# Patient Record
Sex: Female | Born: 1935 | Race: White | Hispanic: No | Marital: Married | Smoking: Never smoker
Health system: Southern US, Community
[De-identification: ages and names within clinical notes are randomized; demographics above are authoritative.]

## PROBLEM LIST (undated history)

## (undated) DIAGNOSIS — Z86010 Personal history of colonic polyps: Secondary | ICD-10-CM

## (undated) DIAGNOSIS — B029 Zoster without complications: Secondary | ICD-10-CM

## (undated) DIAGNOSIS — C50919 Malignant neoplasm of unspecified site of unspecified female breast: Secondary | ICD-10-CM

## (undated) DIAGNOSIS — I10 Essential (primary) hypertension: Secondary | ICD-10-CM

## (undated) DIAGNOSIS — J45909 Unspecified asthma, uncomplicated: Secondary | ICD-10-CM

## (undated) DIAGNOSIS — M8580 Other specified disorders of bone density and structure, unspecified site: Secondary | ICD-10-CM

## (undated) DIAGNOSIS — E785 Hyperlipidemia, unspecified: Secondary | ICD-10-CM

## (undated) DIAGNOSIS — M4850XA Collapsed vertebra, not elsewhere classified, site unspecified, initial encounter for fracture: Secondary | ICD-10-CM

## (undated) HISTORY — DX: Hyperlipidemia, unspecified: E78.5

## (undated) HISTORY — DX: Essential (primary) hypertension: I10

## (undated) HISTORY — DX: Other specified disorders of bone density and structure, unspecified site: M85.80

## (undated) HISTORY — PX: UPPER GASTROINTESTINAL ENDOSCOPY: SHX188

## (undated) HISTORY — DX: Collapsed vertebra, not elsewhere classified, site unspecified, initial encounter for fracture: M48.50XA

## (undated) HISTORY — DX: Unspecified asthma, uncomplicated: J45.909

## (undated) HISTORY — PX: DILATION AND CURETTAGE OF UTERUS: SHX78

## (undated) HISTORY — DX: Personal history of colonic polyps: Z86.010

## (undated) HISTORY — DX: Malignant neoplasm of unspecified site of unspecified female breast: C50.919

## (undated) HISTORY — DX: Zoster without complications: B02.9

---
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
## 1970-06-25 HISTORY — PX: TOTAL ABDOMINAL HYSTERECTOMY: SHX209

## 1997-11-23 ENCOUNTER — Ambulatory Visit (HOSPITAL_BASED_OUTPATIENT_CLINIC_OR_DEPARTMENT_OTHER): Admission: RE | Admit: 1997-11-23 | Discharge: 1997-11-23 | Payer: Self-pay | Admitting: Orthopedic Surgery

## 1999-11-08 ENCOUNTER — Encounter: Payer: Self-pay | Admitting: Family Medicine

## 1999-11-08 ENCOUNTER — Encounter: Admission: RE | Admit: 1999-11-08 | Discharge: 1999-11-08 | Payer: Self-pay | Admitting: Family Medicine

## 1999-12-08 ENCOUNTER — Encounter: Payer: Self-pay | Admitting: Family Medicine

## 1999-12-08 ENCOUNTER — Encounter: Admission: RE | Admit: 1999-12-08 | Discharge: 1999-12-08 | Payer: Self-pay | Admitting: Family Medicine

## 2000-01-03 ENCOUNTER — Encounter: Payer: Self-pay | Admitting: Surgery

## 2000-01-05 ENCOUNTER — Observation Stay (HOSPITAL_COMMUNITY): Admission: RE | Admit: 2000-01-05 | Discharge: 2000-01-06 | Payer: Self-pay | Admitting: Surgery

## 2000-01-23 ENCOUNTER — Other Ambulatory Visit: Admission: RE | Admit: 2000-01-23 | Discharge: 2000-01-23 | Payer: Self-pay | Admitting: Family Medicine

## 2000-06-25 HISTORY — PX: CHOLECYSTECTOMY: SHX55

## 2001-01-23 ENCOUNTER — Other Ambulatory Visit: Admission: RE | Admit: 2001-01-23 | Discharge: 2001-01-23 | Payer: Self-pay | Admitting: Family Medicine

## 2001-01-24 ENCOUNTER — Encounter: Payer: Self-pay | Admitting: Family Medicine

## 2001-01-24 ENCOUNTER — Encounter: Admission: RE | Admit: 2001-01-24 | Discharge: 2001-01-24 | Payer: Self-pay | Admitting: Family Medicine

## 2002-06-15 ENCOUNTER — Other Ambulatory Visit: Admission: RE | Admit: 2002-06-15 | Discharge: 2002-06-15 | Payer: Self-pay | Admitting: Radiology

## 2002-06-25 HISTORY — PX: BREAST LUMPECTOMY: SHX2

## 2002-07-08 ENCOUNTER — Encounter: Payer: Self-pay | Admitting: Surgery

## 2002-07-08 ENCOUNTER — Encounter: Admission: RE | Admit: 2002-07-08 | Discharge: 2002-07-08 | Payer: Self-pay | Admitting: Surgery

## 2002-07-10 ENCOUNTER — Encounter: Admission: RE | Admit: 2002-07-10 | Discharge: 2002-07-10 | Payer: Self-pay | Admitting: Surgery

## 2002-07-10 ENCOUNTER — Encounter: Payer: Self-pay | Admitting: Surgery

## 2002-07-10 ENCOUNTER — Ambulatory Visit (HOSPITAL_BASED_OUTPATIENT_CLINIC_OR_DEPARTMENT_OTHER): Admission: RE | Admit: 2002-07-10 | Discharge: 2002-07-10 | Payer: Self-pay | Admitting: Surgery

## 2002-07-10 ENCOUNTER — Encounter (INDEPENDENT_AMBULATORY_CARE_PROVIDER_SITE_OTHER): Payer: Self-pay | Admitting: Specialist

## 2002-07-21 ENCOUNTER — Ambulatory Visit: Admission: RE | Admit: 2002-07-21 | Discharge: 2002-10-13 | Payer: Self-pay | Admitting: *Deleted

## 2002-11-13 ENCOUNTER — Ambulatory Visit: Admission: RE | Admit: 2002-11-13 | Discharge: 2002-11-13 | Payer: Self-pay | Admitting: *Deleted

## 2003-02-01 ENCOUNTER — Other Ambulatory Visit: Admission: RE | Admit: 2003-02-01 | Discharge: 2003-02-01 | Payer: Self-pay | Admitting: Family Medicine

## 2003-11-29 ENCOUNTER — Encounter: Admission: RE | Admit: 2003-11-29 | Discharge: 2003-11-29 | Payer: Self-pay | Admitting: Family Medicine

## 2004-09-25 ENCOUNTER — Ambulatory Visit: Payer: Self-pay | Admitting: Oncology

## 2005-03-26 ENCOUNTER — Ambulatory Visit: Payer: Self-pay | Admitting: Oncology

## 2005-07-23 ENCOUNTER — Ambulatory Visit: Payer: Self-pay | Admitting: Oncology

## 2005-09-17 ENCOUNTER — Ambulatory Visit: Payer: Self-pay | Admitting: Oncology

## 2006-04-17 ENCOUNTER — Ambulatory Visit: Payer: Self-pay | Admitting: Oncology

## 2006-06-25 DIAGNOSIS — M4850XA Collapsed vertebra, not elsewhere classified, site unspecified, initial encounter for fracture: Secondary | ICD-10-CM

## 2006-06-25 HISTORY — DX: Collapsed vertebra, not elsewhere classified, site unspecified, initial encounter for fracture: M48.50XA

## 2006-07-16 ENCOUNTER — Encounter: Admission: RE | Admit: 2006-07-16 | Discharge: 2006-07-16 | Payer: Self-pay | Admitting: Radiology

## 2006-10-15 ENCOUNTER — Ambulatory Visit: Payer: Self-pay | Admitting: Oncology

## 2006-10-18 LAB — COMPREHENSIVE METABOLIC PANEL
ALT: 14 U/L (ref 0–35)
AST: 22 U/L (ref 0–37)
Albumin: 4.3 g/dL (ref 3.5–5.2)
CO2: 26 mEq/L (ref 19–32)
Potassium: 4.4 mEq/L (ref 3.5–5.3)
Total Bilirubin: 0.3 mg/dL (ref 0.3–1.2)

## 2006-10-18 LAB — CBC WITH DIFFERENTIAL/PLATELET
BASO%: 0.8 % (ref 0.0–2.0)
EOS%: 2.2 % (ref 0.0–7.0)
MCHC: 35.2 g/dL (ref 32.0–36.0)
MCV: 90.4 fL (ref 81.0–101.0)
NEUT#: 2.7 10*3/uL (ref 1.5–6.5)
Platelets: 317 10*3/uL (ref 145–400)
RBC: 4.16 10*6/uL (ref 3.70–5.32)

## 2007-04-14 ENCOUNTER — Ambulatory Visit: Payer: Self-pay | Admitting: Oncology

## 2007-10-13 ENCOUNTER — Ambulatory Visit: Payer: Self-pay | Admitting: Oncology

## 2007-10-15 LAB — COMPREHENSIVE METABOLIC PANEL
ALT: 16 U/L (ref 0–35)
Albumin: 4.4 g/dL (ref 3.5–5.2)
Alkaline Phosphatase: 76 U/L (ref 39–117)
BUN: 14 mg/dL (ref 6–23)
CO2: 28 mEq/L (ref 19–32)
Calcium: 9.5 mg/dL (ref 8.4–10.5)
Total Protein: 7.4 g/dL (ref 6.0–8.3)

## 2008-04-25 DIAGNOSIS — I10 Essential (primary) hypertension: Secondary | ICD-10-CM

## 2008-04-25 HISTORY — DX: Essential (primary) hypertension: I10

## 2010-04-05 ENCOUNTER — Encounter (INDEPENDENT_AMBULATORY_CARE_PROVIDER_SITE_OTHER): Payer: Self-pay | Admitting: *Deleted

## 2010-07-15 ENCOUNTER — Encounter: Payer: Self-pay | Admitting: Family Medicine

## 2010-07-25 NOTE — Letter (Signed)
Summary: Colonoscopy Date Change Letter  [facility]  [address] [place], [place] [address]   Phone: [phone]  Fax: [phone]      April 05, 2010 MRN: [id number]   Brandy Andrade [address]  [address] [place], [place]  [address]   Dear Ms. Mable Paris,   Previously you were recommended to have a repeat colonoscopy around this time. Your chart was recently reviewed by Dr. Leone Payor of [facility]. Follow up colonoscopy is now recommended in September 2014. This revised recommendation is based on current, nationally recognized guidelines for colorectal cancer screening and polyp surveillance. These guidelines are endorsed by the American Cancer Society, [employer] on Colorectal Cancer as well as numerous other major medical organizations.  Please understand that our recommendation assumes that you do not have any new symptoms such as bleeding, a change in bowel habits, anemia, or significant abdominal discomfort. If you do have any concerning GI symptoms or want to discuss the guideline recommendations, please call to arrange an office visit at your earliest convenience. Otherwise we will keep you in our reminder system and contact you 1-2 months prior to the date listed above to schedule your next colonoscopy.  Thank you,   Iva Boop, M.D.  [facility] [phone]

## 2010-11-10 NOTE — Op Note (Signed)
NAME:  Brandy Andrade, Brandy Andrade                       ACCOUNT NO.:  [id number]   MEDICAL RECORD NO.:  [id number]                   PATIENT TYPE:  AMB   LOCATION:  DSC                                  FACILITY:  [facility]   PHYSICIAN:  Currie Paris, M.D.           DATE OF BIRTH:  23-Jan-1935   DATE OF PROCEDURE:  07/10/2002  DATE OF DISCHARGE:                                 OPERATIVE REPORT   [facility] [phone].   PREOPERATIVE DIAGNOSIS:  Carcinoma of left breast upper outer quadrant.   POSTOPERATIVE DIAGNOSIS:  Carcinoma of left breast upper outer quadrant.   PROCEDURE:  Needle-guided excision of left breast cancer with blue dye  injection and sentinel lymph node dissection.   SURGEON:  Currie Paris, M.D.   ANESTHESIA:  General.   CLINICAL HISTORY:  The patient is a 76 year old who originally had an  abnormality seen on mammography, and a core biopsy had shown invasive  carcinoma.  After a discussion with the patient about alternatives, we  elected to do a needle-guided excision with blue dye injection for sentinel  node dissection.   DESCRIPTION OF PROCEDURE:  The patient was seen in the holding area and had  no further questions.  Two guidewires to bracket the area had been placed  and her radionuclide injected.  The right side was marked as the operative  side.   The patient was taken to the operating room and after satisfactory general  anesthesia, the breast was prepped and draped.  Four cubic centimeters of  Lymphazurin blue dye was then injected subareolarly and massaged in.  While  we were doing that, I tried to identify an area in the axilla that was hot  using the Neoprobe but could only find one area that had some slightly  elevated counts, with everything else being virtually zero.  Once I had  waited the appropriate five minutes, I elected to go ahead with the  lumpectomy first.  I made a curvilinear incision.  There were two  guidewires, one more superior to the  other but both in the upper outer  quadrant.  They both entered almost directly laterally and tracked almost  directly medially.  The curvilinear incision was made and I used cautery to  divide about a centimeter of breast fatty tissue and then, using the cautery  again, I took a wide area around both guidewires, trying to encompass both  of them, and going down as we went down and encountered the pectoralis  muscle and these tracked anterior to the muscle, so I continued the excision  until really I was almost to the midline, fairly lateral guidewire entrance  site.  When the specimen was out, I put some clips on it to mark it and I  could palpate the tumor, and I thought I was somewhat close to what would  actually be the inferior edge near the deep area of the excision.  I  immediately went back and took another 1.5 cm of tissue around this area to  be sure that I had a clear margin.  Bleeders were electrocoagulated.  Marcaine 0.5% was injected to help with the postop analgesia.  Clips were  marked to mark the margins using four clips on the periphery and a large  clip superior and deep and superficial.   While waiting for that, I went ahead and made a transverse incision in the  axilla.  As I divided down, I never did identify any blue dye except one  tiny lymphatic that did not seem to go anyplace.  Palpation revealed no  enlarged nodes but using the Neoprobe, I found one area that had some  slightly high counts and a little bit more dissection there revealed a node,  which I was able to excise.  It had counts of about 20-24, and everything in  the background was 0-3 and I never saw any other counts higher than 3  anyplace else in the axilla.  Most of the counts around the nipple-areolar  complex were in the up to 1000 with one area that was up to 1700, but a  little bit lower than I normally see in that area.   I sent this one node over and then spent a few more minutes using the   Neoprobe trying to see if there were any other nodes or hot areas that I  could find and looked a little further for some blue dye, and then palpated  very carefully to make sure I could not feel any large nodes, and all this  was negative.   I then injected some Marcaine here.  I did use a clip on one bleeder here  and then mainly the cautery.   Both incisions were closed with 3-0 Vicryl and 4-0 Monocryl.  I left the  breast cavity, did not try to close the breast tissue but just the subcu and  the dermis.   Dr. Almyra Free reported that the sentinel node was negative.  She did not feel  that she would gain much from touch preps, as we at least grossly looked  well around the tumor, especially with the extra area taken.   The patient tolerated the procedure well.  There were no operative  complications.  All counts were correct.                                               Currie Paris, M.D.    CJS/MEDQ  D:  07/10/2002  T:  07/11/2002  Job:  [id number]   cc:   Quita Skye. Artis Flock, M.D.  [address]  [place]  [place] [address]  Fax: [phone]   Jeralyn Ruths, M.D.

## 2011-08-22 ENCOUNTER — Encounter: Payer: Self-pay | Admitting: Internal Medicine

## 2011-08-22 ENCOUNTER — Ambulatory Visit (INDEPENDENT_AMBULATORY_CARE_PROVIDER_SITE_OTHER): Payer: Medicare Other | Admitting: Internal Medicine

## 2011-08-22 VITALS — BP 140/88 | HR 103 | Temp 98.9°F | Ht 59.5 in | Wt 155.6 lb

## 2011-08-22 DIAGNOSIS — R05 Cough: Secondary | ICD-10-CM

## 2011-08-22 DIAGNOSIS — R053 Chronic cough: Secondary | ICD-10-CM | POA: Insufficient documentation

## 2011-08-22 MED ORDER — FLUTICASONE PROPIONATE 50 MCG/ACT NA SUSP: 2.0000 | Freq: Every day | NASAL | Status: DC

## 2011-08-22 NOTE — Patient Instructions (Addendum)
Cough is from sinus drainage, possible acid reflux, and possible asthma and possibly due to bp drug losartan #Sinus drainage  - start/continue netti pot daily  - do at night. Use bottled or distilled water.  -  start nasal steroid generic fluticasone inhaler 2 squirts each nostril daily as advised (nurse will do script)  #Possible Acid Reflux  - take otc ranitidine 300mg  at night   - take diet sheet from [place] - avoid colas, spices, cheeses, spirits, red meats, beer, chocolates, fried foods etc.,   - sleep with head end of bed elevated  - eat small frequent meals  - do - depending on course might need barium swallow given difficulty with tabletsnot go to bed for 3 hours after last meal -  #Possible Asthma  - do methacholine challenge test (you cannot be pregnant or have heart disease for this test)  - call once done for me to review this result  #Cyclical cough  -  at all times there  there is urge to cough, drink water or swallow or sip on throat lozenge  #BP drug  - depending on how you do we can decide on stopping losartan in case this is contributing to cough  #Followup - I will see you in 4 - any problems call or come sooner - cough score at followup

## 2011-08-22 NOTE — Progress Notes (Signed)
Subjective:    Patient ID: Brandy Andrade, female    DOB: [birth year], 77 y.o.   MRN: [id number]  HPI  IOV 08/22/2011 77 year old  Body mass index is 30.90 kg/(m^2).  reports that she has never smoked. She does not have any smokeless tobacco history on file. Referred by Dr Elberta Spaniel for chronic cough  Reports cough since October 2012. Insidious onset. Initially thought it was allergy. She remembers abrupt onset when she was pulling weeds and since then cough never subsided. Mostly dry in quality but past few days productive of clear sputum that she thinks is due to increased sinus drainage. Rates cough as severe. She reports associated wheeze for first 1 month but this is resolved. Stable since onset. She denies tickle in throat normally other than today due to sinus drainage increase past few days. Does not normally gag but sometimes when cough is worse she gags.  Denies associated dyspnea, hemoptysis, fever, URI, flu symptoms, chest pains or clearing of throat. She is finding it socially embarrassing.   RSI cough score 10 (leve 5 - annoying cough, Leve 1 - hoarseness, clearing of throat, post nasal drip dificulty with tablets, and sensation of something in throat)   She is not on ACE inhibitors but on ARB losartan since 2003. She has hx of actonel making cough worse similar to current episode and since then trouble with tablets +. Cough resolved on stopping actonel within 2 weeks. Denies GERD. Denies chronic sinus drainage other than spring and fall and  during periods of low pressure in atmosphere past few days acting up. However, she denies sinus is making cough worse. Denies asthma diagnosis. Recollect 2 x normal cxr since oct 2012. REcollects trial of nexium Aug 05, 2010 but stopped after a week due to diarrhea. REcollects H2 blockade trial an ICS trial in nov 2012 short term without relief.     CT sinus 11/29/2003: no evidence of acute sinusitis per report (PATIENT INSISTS THIS WAS HER  HUSBAND'S SCAN OF SINUS AND IS AN ERROR IN EMR)   Past Medical History  Diagnosis Date  . Hyperlipidemia   . Glaucoma   . Osteopenia   . Breast cancer   . Post-nasal drainage   . Cough      Family History  Problem Relation Age of Onset  . Heart attack Mother   . Breast cancer      several aunts  . Emphysema Sister      History   Social History  . Marital Status: Married    Spouse Name: N/A    Number of Children: N/A  . Years of Education: N/A   Occupational History  . retired    Social History Main Topics  . Smoking status: Never Smoker   . Smokeless tobacco: Not on file  . Alcohol Use: No  . Drug Use: No  . Sexually Active: Not on file   Other Topics Concern  . Not on file   Social History Narrative  . No narrative on file     No Known Allergies   No outpatient prescriptions prior to visit.   Current outpatient prescriptions:CRESTOR 10 MG tablet, Take 1 tablet by mouth Daily., Disp: , Rfl: ;  losartan-hydrochlorothiazide (HYZAAR) 50-12.5 MG per tablet, Take 1 tablet by mouth daily., Disp: , Rfl: ;  NON FORMULARY, xlatan pohth drops, Disp: , Rfl:     Review of Systems  Constitutional: Negative for fever and unexpected weight change.  HENT: Positive for congestion  and postnasal drip. Negative for ear pain, nosebleeds, sore throat, rhinorrhea, sneezing, trouble swallowing, dental problem and sinus pressure.   Eyes: Negative for redness and itching.  Respiratory: Positive for cough. Negative for chest tightness, shortness of breath and wheezing.   Cardiovascular: Negative for palpitations and leg swelling.  Gastrointestinal: Negative for nausea and vomiting.  Genitourinary: Negative for dysuria.  Musculoskeletal: Negative for joint swelling.  Skin: Negative for rash.  Neurological: Negative for headaches.  Hematological: Does not bruise/bleed easily.  Psychiatric/Behavioral: Negative for dysphoric mood. The patient is not nervous/anxious.          Objective:   Physical Exam  Vitals reviewed. Constitutional: She is oriented to person, place, and time. She appears well-developed and well-nourished. No distress.       Body mass index is 30.90 kg/(m^2).   HENT:  Head: Normocephalic and atraumatic.  Right Ear: External ear normal.  Left Ear: External ear normal.  Mouth/Throat: Oropharynx is clear and moist. No oropharyngeal exudate.       Significant post nasal drip + in uvula  Eyes: Conjunctivae and EOM are normal. Pupils are equal, round, and reactive to light. Right eye exhibits no discharge. Left eye exhibits no discharge. No scleral icterus.  Neck: Normal range of motion. Neck supple. No JVD present. No tracheal deviation present. No thyromegaly present.  Cardiovascular: Normal rate, regular rhythm, normal heart sounds and intact distal pulses.  Exam reveals no gallop and no friction rub.   No murmur heard. Pulmonary/Chest: Effort normal and breath sounds normal. No respiratory distress. She has no wheezes. She has no rales. She exhibits no tenderness.  Abdominal: Soft. Bowel sounds are normal. She exhibits no distension and no mass. There is no tenderness. There is no rebound and no guarding.  Musculoskeletal: Normal range of motion. She exhibits no edema and no tenderness.  Lymphadenopathy:    She has no cervical adenopathy.  Neurological: She is alert and oriented to person, place, and time. She has normal reflexes. No cranial nerve deficit. She exhibits normal muscle tone. Coordination normal.  Skin: Skin is warm and dry. No rash noted. She is not diaphoretic. No erythema. No pallor.  Psychiatric: She has a normal mood and affect. Her behavior is normal. Judgment and thought content normal.          Assessment & Plan:

## 2011-08-22 NOTE — Assessment & Plan Note (Signed)
Cough is from sinus drainage, possible acid reflux, and possible asthma and possibly due to bp drug losartan #Sinus drainage  - start/continue netti pot daily  - do at night. Use bottled or distilled water.  -  start nasal steroid generic fluticasone inhaler 2 squirts each nostril daily as advised (nurse will do script)  #Possible Acid Reflux  - take otc ranitidine 300mg  at night   - take diet sheet from [place] - avoid colas, spices, cheeses, spirits, red meats, beer, chocolates, fried foods etc.,   - sleep with head end of bed elevated  - eat small frequent meals  - do not go to bed for 3 hours after last meal - depending on course might need barium swallow given difficulty with tablets  #Possible Asthma  - do methacholine challenge test (you cannot be pregnant or have heart disease for this test)  - call once done for me to review this result  #Cyclical cough  -  at all times there  there is urge to cough, drink water or swallow or sip on throat lozenge  #BP drug  - depending on how you do we can decide on stopping losartan in case this is contributing to cough  #Followup - I will see you in 4 - any problems call or come sooner - cough score at followup

## 2011-08-27 ENCOUNTER — Encounter: Payer: Self-pay | Admitting: Internal Medicine

## 2011-08-31 ENCOUNTER — Ambulatory Visit (HOSPITAL_COMMUNITY)
Admission: RE | Admit: 2011-08-31 | Discharge: 2011-08-31 | Disposition: A | Discharge status: Home / Self Care | Payer: Medicare Other | Source: Ambulatory Visit | Attending: Internal Medicine | Admitting: Internal Medicine

## 2011-08-31 DIAGNOSIS — R059 Cough, unspecified: Secondary | ICD-10-CM | POA: Insufficient documentation

## 2011-08-31 DIAGNOSIS — R05 Cough: Secondary | ICD-10-CM

## 2011-08-31 LAB — PULMONARY FUNCTION TEST

## 2011-08-31 MED ORDER — METHACHOLINE 4 MG/ML NEB SOLN: 2.0000 mL | Freq: Once | RESPIRATORY_TRACT | Status: DC

## 2011-08-31 MED ORDER — SODIUM CHLORIDE 0.9 % IN NEBU
3.0000 mL | INHALATION_SOLUTION | Freq: Once | RESPIRATORY_TRACT | Status: AC
  Administered 2011-08-31: 3 mL via RESPIRATORY_TRACT

## 2011-08-31 MED ORDER — METHACHOLINE 1 MG/ML NEB SOLN
2.0000 mL | Freq: Once | RESPIRATORY_TRACT | Status: AC
  Administered 2011-08-31: 2 mg via RESPIRATORY_TRACT

## 2011-08-31 MED ORDER — METHACHOLINE 0.0625 MG/ML NEB SOLN
2.0000 mL | Freq: Once | RESPIRATORY_TRACT | Status: AC
  Administered 2011-08-31: 0.125 mg via RESPIRATORY_TRACT

## 2011-08-31 MED ORDER — METHACHOLINE 16 MG/ML NEB SOLN: 2.0000 mL | Freq: Once | RESPIRATORY_TRACT | Status: DC

## 2011-08-31 MED ORDER — ALBUTEROL SULFATE (5 MG/ML) 0.5% IN NEBU
2.5000 mg | INHALATION_SOLUTION | Freq: Once | RESPIRATORY_TRACT | Status: AC
  Administered 2011-08-31: 2.5 mg via RESPIRATORY_TRACT

## 2011-08-31 MED ORDER — METHACHOLINE 0.25 MG/ML NEB SOLN
2.0000 mL | Freq: Once | RESPIRATORY_TRACT | Status: AC
  Administered 2011-08-31: 0.5 mg via RESPIRATORY_TRACT

## 2011-09-05 ENCOUNTER — Telehealth: Payer: Self-pay | Admitting: Internal Medicine

## 2011-09-05 NOTE — Telephone Encounter (Signed)
MR, please advise results of MCT thanks!

## 2011-09-05 NOTE — Telephone Encounter (Signed)
Pt notified and was fine with waiting a few days for a call on the methacholine test results. Will forward back to MR so we may call the pt once results have been reviewed.

## 2011-09-05 NOTE — Telephone Encounter (Signed)
JC put several papers on my desk this am. I will check if it is there. It might be few days before I get back

## 2011-09-06 NOTE — Telephone Encounter (Signed)
I spoke with pt and is aware of results. She had no questions and will discuss this with MR at her next OV

## 2011-09-06 NOTE — Telephone Encounter (Signed)
Methacholine challenge 08/31/11: positive for asthma vs vocal cord irritation. I will explain on fu 09/19/11

## 2011-09-19 ENCOUNTER — Encounter: Payer: Self-pay | Admitting: Internal Medicine

## 2011-09-19 ENCOUNTER — Ambulatory Visit (INDEPENDENT_AMBULATORY_CARE_PROVIDER_SITE_OTHER): Payer: Medicare Other | Admitting: Internal Medicine

## 2011-09-19 VITALS — BP 120/78 | HR 78 | Temp 98.0°F | Ht 59.0 in | Wt 154.6 lb

## 2011-09-19 DIAGNOSIS — R05 Cough: Secondary | ICD-10-CM

## 2011-09-19 NOTE — Patient Instructions (Addendum)
Cough is from sinus drainage, possible acid reflux, and  asthma  The Methacholine challenge test shows asthma and also likely vocal cord irritation Glad you are much improved  #Sinus drainage - glad this is resolved  - continue netti pot do few times a week for next month and then back off and then see how it goes daily  - do at night. Use bottled or distilled water.  -  Continue  nasal steroid generic fluticasone inhaler 1 squirts each nostril daily as advised through summer and then back off  #Possible Acid Reflux  - continue otc ranitidine 300mg  at night   - take diet sheet from [place] - avoid colas, spices, cheeses, spirits, red meats, beer, chocolates, fried foods etc.,   - sleep with head end of bed elevated  - eat small frequent meals - depending on course might need barium swallow given difficulty with tablets  - do not go to bed for 3 hours after last meal -  #e Asthma  - methaacholine challenge test shows asthma - try qvar 2 puff twice daily but use with spacer (my nurse will call Dr Geryl Rankins office in [facility] and get clearance) - at followup check alpha 1   #Cyclical cough  - the methacholine challenge test suggests vocal cord irritation  - please visit with Mr Verdie Mosher speech therapist  -  at all times there  there is urge to cough, drink water or swallow or sip on throat lozenge  #BP drug  - depending on how you do we can decide on stopping losartan in case this is contributing to cough  #Followup - I will see you in 6-8 weks - any problems call or come sooner - cough score at followup - alpha 1 check at followup

## 2011-09-19 NOTE — Progress Notes (Addendum)
Subjective:    Patient ID: Brandy Andrade, female    DOB: 1935-05-30, 77 y.o.   MRN: [id number]  HPI  Subjective:    Patient ID: Brandy Andrade, female    DOB: [birth year], 77 y.o.   MRN: [id number]  HPI  IOV 08/22/2011 77 year old  Body mass index is 30.90 kg/(m^2).  reports that she has never smoked. She does not have any smokeless tobacco history on file. Referred by Dr Elberta Spaniel for chronic cough  Reports cough since October 2012. Insidious onset. Initially thought it was allergy. She remembers abrupt onset when she was pulling weeds and since then cough never subsided. Mostly dry in quality but past few days productive of clear sputum that she thinks is due to increased sinus drainage. Rates cough as severe. She reports associated wheeze for first 1 month but this is resolved. Stable since onset. She denies tickle in throat normally other than today due to sinus drainage increase past few days. Does not normally gag but sometimes when cough is worse she gags.  Denies associated dyspnea, hemoptysis, fever, URI, flu symptoms, chest pains or clearing of throat. She is finding it socially embarrassing.   RSI cough score 10 (leve 5 - annoying cough, Leve 1 - hoarseness, clearing of throat, post nasal drip dificulty with tablets, and sensation of something in throat)   She is not on ACE inhibitors but on ARB losartan since 2003. She has hx of actonel making cough worse similar to current episode and since then trouble with tablets +. Cough resolved on stopping actonel within 2 weeks. Denies GERD. Denies chronic sinus drainage other than spring and fall and  during periods of low pressure in atmosphere past few days acting up. However, she denies sinus is making cough worse. Denies asthma diagnosis. Recollect 2 x normal cxr since oct 2012. REcollects trial of nexium Aug 05, 2010 but stopped after a week due to diarrhea. REcollects H2 blockade trial an ICS trial in nov 2012 short term without  relief.     CT sinus 11/29/2003: no evidence of acute sinusitis per report (PATIENT INSISTS THIS WAS HER HUSBAND'S SCAN OF SINUS AND IS AN ERROR IN EMR)  REC Cough is from sinus drainage, possible acid reflux, and possible asthma and possibly due to bp drug losartan #Sinus drainage  - start/continue netti pot daily  - do at night. Use bottled or distilled water.  -  start nasal steroid generic fluticasone inhaler 2 squirts each nostril daily as advised (nurse will do script)  #Possible Acid Reflux  - take otc ranitidine 300mg  at night (no ppi due to diarrhea)   - take diet sheet from [place] - avoid colas, spices, cheeses, spirits, red meats, beer, chocolates, fried foods etc.,   - sleep with head end of bed elevated  - eat small frequent meals  depending on course might need barium swallow given difficulty with tablets Do not go to bed for 3 hours after last meal -  #Possible Asthma  - do methacholine challenge test (you cannot be pregnant or have heart disease for this test)  - call once done for me to review this result  #Cyclical cough  -  at all times there  there is urge to cough, drink water or swallow or sip on throat lozenge  #BP drug  - depending on how you do we can decide on stopping losartan in case this is contributing to cough  #Followup - I will see you in  4 weeks - any problems call or come sooner - cough score at followup     OV 09/19/2011  Cough followup. Subjectively better significantly and she is pleased with results. Objectively, RSI cough score is unchanged. It is still 10. Level 3 - difficulty swallowing tablets. Level 2 - clearing of throat, and toublesome cough. Level 1 - hoarseness of voice, post nasal drip, and sensation of something in throat. I questioned her on the subjective and objective discrepancy; she maintains she is vastly improved from prior OV. Test restuls shows Methacholine challenge 08/31/11: positive for asthma vs vocal cord irritation. THere  is no insp loop flattening but expiration has cough  Past, Family, Social reviewed: multiple family members with asthma / copd   Current outpatient prescriptions:beclomethasone (QVAR) 80 MCG/ACT inhaler, Inhale 2 puffs into the lungs 2 (two) times daily., Disp: , Rfl: ;  CRESTOR 10 MG tablet, Take 1 tablet by mouth Daily., Disp: , Rfl: ;  fluticasone (FLONASE) 50 MCG/ACT nasal spray, Place 2 sprays into the nose daily., Disp: 16 g, Rfl: 6;  latanoprost (XALATAN) 0.005 % ophthalmic solution, Daily., Disp: , Rfl:  losartan-hydrochlorothiazide (HYZAAR) 50-12.5 MG per tablet, Take 1 tablet by mouth daily., Disp: , Rfl: ;  ranitidine (ZANTAC) 300 MG capsule, Take 300 mg by mouth daily., Disp: , Rfl:          Objective:   Physical Exam  Vitals reviewed. Constitutional: She is oriented to person, place, and time. She appears well-developed and well-nourished. No distress.       Body mass index is 30.90 kg/(m^2).   HENT:  Head: Normocephalic and atraumatic.  Right Ear: External ear normal.  Left Ear: External ear normal.  Mouth/Throat: Oropharynx is clear and moist. No oropharyngeal exudate.       Significant post nasal drip + in uvula  Eyes: Conjunctivae and EOM are normal. Pupils are equal, round, and reactive to light. Right eye exhibits no discharge. Left eye exhibits no discharge. No scleral icterus.  Neck: Normal range of motion. Neck supple. No JVD present. No tracheal deviation present. No thyromegaly present.  Cardiovascular: Normal rate, regular rhythm, normal heart sounds and intact distal pulses.  Exam reveals no gallop and no friction rub.   No murmur heard. Pulmonary/Chest: Effort normal and breath sounds normal. No respiratory distress. She has no wheezes. She has no rales. She exhibits no tenderness.  Abdominal: Soft. Bowel sounds are normal. She exhibits no distension and no mass. There is no tenderness. There is no rebound and no guarding.  Musculoskeletal: Normal range of  motion. She exhibits no edema and no tenderness.  Lymphadenopathy:    She has no cervical adenopathy.  Neurological: She is alert and oriented to person, place, and time. She has normal reflexes. No cranial nerve deficit. She exhibits normal muscle tone. Coordination normal.  Skin: Skin is warm and dry. No rash noted. She is not diaphoretic. No erythema. No pallor.  Psychiatric: She has a normal mood and affect. Her behavior is normal. Judgment and thought content normal.          Assessment & Plan:     Review of Systems  Constitutional: Negative for fever and unexpected weight change.  HENT: Positive for postnasal drip. Negative for ear pain, nosebleeds, congestion, sore throat, rhinorrhea, sneezing, trouble swallowing, dental problem and sinus pressure.   Eyes: Negative for redness and itching.  Respiratory: Positive for cough. Negative for chest tightness, shortness of breath and wheezing.   Cardiovascular: Negative for palpitations  and leg swelling.  Gastrointestinal: Negative for nausea and vomiting.  Genitourinary: Negative for dysuria.  Musculoskeletal: Negative for joint swelling.  Skin: Negative for rash.  Neurological: Negative for headaches.  Hematological: Does not bruise/bleed easily.  Psychiatric/Behavioral: Negative for dysphoric mood. The patient is not nervous/anxious.        Objective:   Physical Exam        Assessment & Plan:

## 2011-09-25 ENCOUNTER — Encounter: Payer: Self-pay | Admitting: Internal Medicine

## 2011-09-25 NOTE — Assessment & Plan Note (Signed)
Subjectively better but objective cough score unchanged at 10  PLAN Cough is from sinus drainage, possible acid reflux, and  asthma  The Methacholine challenge test shows asthma and also likely vocal cord irritation Glad you are much improved  #Sinus drainage - glad this is resolved  - continue netti pot do few times a week for next month and then back off and then see how it goes daily  - do at night. Use bottled or distilled water.  -  Continue  nasal steroid generic fluticasone inhaler 1 squirts each nostril daily as advised through summer and then back off  #Possible Acid Reflux  - continue otc ranitidine 300mg  at night   - take diet sheet from [place] - avoid colas, spices, cheeses, spirits, red meats, beer, chocolates, fried foods etc.,   - sleep with head end of bed elevated  - eat small frequent meals - depending on course might need barium swallow given difficulty with tablets  - do not go to bed for 3 hours after last meal -  #e Asthma  - methaacholine challenge test shows asthma - try qvar 2 puff twice daily but use with spacer (my nurse will call Dr Geryl Rankins office in [facility] and get clearance)  #Cyclical cough  - the methacholine challenge test suggests vocal cord irritation  - please visit with Mr Verdie Mosher speech therapist  -  at all times there  there is urge to cough, drink water or swallow or sip on throat lozenge  #BP drug  - depending on how you do we can decide on stopping losartan in case this is contributing to cough  #Followup - I will see you in 6-8 weks - any problems call or come sooner - cough score at followup

## 2011-09-27 ENCOUNTER — Telehealth: Payer: Self-pay | Admitting: Internal Medicine

## 2011-09-27 ENCOUNTER — Ambulatory Visit: Payer: Medicare Other | Attending: Internal Medicine

## 2011-09-27 DIAGNOSIS — R498 Other voice and resonance disorders: Secondary | ICD-10-CM | POA: Insufficient documentation

## 2011-09-27 DIAGNOSIS — IMO0001 Reserved for inherently not codable concepts without codable children: Secondary | ICD-10-CM | POA: Insufficient documentation

## 2011-09-27 MED ORDER — BECLOMETHASONE DIPROPIONATE 80 MCG/ACT IN AERS: 2.0000 | INHALATION_SPRAY | Freq: Two times a day (BID) | RESPIRATORY_TRACT | Status: DC

## 2011-09-27 NOTE — Telephone Encounter (Signed)
I spoke with pt and she stated she needed her qvar 80 sent to the pharmacy. She states it helps with her breathing and she is not coughing as much now. I advised will send rx and nothing further was needed

## 2011-10-02 ENCOUNTER — Ambulatory Visit: Payer: Medicare Other

## 2011-10-05 ENCOUNTER — Ambulatory Visit: Payer: Medicare Other

## 2011-10-15 ENCOUNTER — Ambulatory Visit: Payer: Medicare Other

## 2011-11-09 ENCOUNTER — Ambulatory Visit (INDEPENDENT_AMBULATORY_CARE_PROVIDER_SITE_OTHER): Payer: Medicare Other | Admitting: Internal Medicine

## 2011-11-09 ENCOUNTER — Encounter: Payer: Self-pay | Admitting: Internal Medicine

## 2011-11-09 VITALS — BP 138/78 | HR 84 | Temp 97.0°F | Ht 59.0 in | Wt 152.4 lb

## 2011-11-09 DIAGNOSIS — R05 Cough: Secondary | ICD-10-CM

## 2011-11-09 NOTE — Progress Notes (Signed)
Subjective:    Patient ID: Brandy Andrade, female    DOB: Feb 09, 1935, 77 y.o.   MRN: [id number]  HPI  IOV 08/22/2011 77 year old  Body mass index is 30.90 kg/(m^2).  reports that she has never smoked. She does not have any smokeless tobacco history on file. Referred by Dr Elberta Spaniel for chronic cough  Reports cough since October 2012. Insidious onset. Initially thought it was allergy. She remembers abrupt onset when she was pulling weeds and since then cough never subsided. Mostly dry in quality but past few days productive of clear sputum that she thinks is due to increased sinus drainage. Rates cough as severe. She reports associated wheeze for first 1 month but this is resolved. Stable since onset. She denies tickle in throat normally other than today due to sinus drainage increase past few days. Does not normally gag but sometimes when cough is worse she gags.  Denies associated dyspnea, hemoptysis, fever, URI, flu symptoms, chest pains or clearing of throat. She is finding it socially embarrassing.   RSI cough score 10 (leve 5 - annoying cough, Leve 1 - hoarseness, clearing of throat, post nasal drip dificulty with tablets, and sensation of something in throat)   She is not on ACE inhibitors but on ARB losartan since 2003. She has hx of actonel making cough worse similar to current episode and since then trouble with tablets +. Cough resolved on stopping actonel within 2 weeks. Denies GERD. Denies chronic sinus drainage other than spring and fall and  during periods of low pressure in atmosphere past few days acting up. However, she denies sinus is making cough worse. Denies asthma diagnosis. Recollect 2 x normal cxr since oct 2012. REcollects trial of nexium Aug 05, 2010 but stopped after a week due to diarrhea. REcollects H2 blockade trial an ICS trial in nov 2012 short term without relief.     CT sinus 11/29/2003: no evidence of acute sinusitis per report (PATIENT INSISTS THIS WAS HER  HUSBAND'S SCAN OF SINUS AND IS AN ERROR IN EMR)  REC Cough is from sinus drainage, possible acid reflux, and possible asthma and possibly due to bp drug losartan    OV 09/19/2011  Cough followup. Subjectively better significantly and she is pleased with results. Objectively, RSI cough score is unchanged. It is still 10. Level 3 - difficulty swallowing tablets. Level 2 - clearing of throat, and toublesome cough. Level 1 - hoarseness of voice, post nasal drip, and sensation of something in throat. I questioned her on the subjective and objective discrepancy; she maintains she is vastly improved from prior OV. Test restuls shows Methacholine challenge 08/31/11: positive for asthma vs vocal cord irritation. THere is no insp loop flattening but expiration has cough  Past, Family, Social reviewed: multiple family members with asthma / copd   Current outpatient prescriptions:beclomethasone (QVAR) 80 MCG/ACT inhaler, Inhale 2 puffs into the lungs 2 (two) times daily., Disp: , Rfl: ;  CRESTOR 10 MG tablet, Take 1 tablet by mouth Daily., Disp: , Rfl: ;  fluticasone (FLONASE) 50 MCG/ACT nasal spray, Place 2 sprays into the nose daily., Disp: 16 g, Rfl: 6;  latanoprost (XALATAN) 0.005 % ophthalmic solution, Daily., Disp: , Rfl:  losartan-hydrochlorothiazide (HYZAAR) 50-12.5 MG per tablet, Take 1 tablet by mouth daily., Disp: , Rfl: ;  ranitidine (ZANTAC) 300 MG capsule, Take 300 mg by mouth daily., Disp: , Rfl:    Cough is from sinus drainage, possible acid reflux, and asthma  The Methacholine challenge  test shows asthma and also likely vocal cord irritation  Glad you are much improved    OV 11/09/2011  Fu cough. Cough all resolved. RSi cough score is only 1 - for post nasal drip; otherwise all else 0. She has some sinus drainage due to pollen and flowers around the house that makes her cough. But she feels other than that completely well. Feels [name] made world of difference for her. Very compliant  with ssinus, gerd and asthma Rx. No new issues. Pulse ox at rest on RA 92%. Walking desaturation 185 feet x 3 laps:  95% to 97% and did not desaturate  Past, Family, Social reviewed: no change since last visit other than her husband had knee replacement 2 days ago   Review of Systems  Constitutional: Negative for fever and unexpected weight change.  HENT: Negative for ear pain, nosebleeds, congestion, sore throat, rhinorrhea, sneezing, trouble swallowing, dental problem, postnasal drip and sinus pressure.   Eyes: Negative for redness and itching.  Respiratory: Negative for cough, chest tightness, shortness of breath and wheezing.   Cardiovascular: Negative for palpitations and leg swelling.  Gastrointestinal: Negative for nausea and vomiting.  Genitourinary: Negative for dysuria.  Musculoskeletal: Negative for joint swelling.  Skin: Negative for rash.  Neurological: Negative for headaches.  Hematological: Does not bruise/bleed easily.  Psychiatric/Behavioral: Negative for dysphoric mood. The patient is not nervous/anxious.        Objective:   Physical Exam Vitals reviewed. Constitutional: She is oriented to person, place, and time. She appears well-developed and well-nourished. No distress.       Body mass index is 30.90 kg/(m^2).   HENT:  Head: Normocephalic and atraumatic.  Right Ear: External ear normal.  Left Ear: External ear normal.  Mouth/Throat: Oropharynx is clear and moist. No oropharyngeal exudate.       Significant post nasal drip + in uvula of last visit has resolved Eyes: Conjunctivae and EOM are normal. Pupils are equal, round, and reactive to light. Right eye exhibits no discharge. Left eye exhibits no discharge. No scleral icterus.  Neck: Normal range of motion. Neck supple. No JVD present. No tracheal deviation present. No thyromegaly present.  Cardiovascular: Normal rate, regular rhythm, normal heart sounds and intact distal pulses.  Exam reveals no gallop and no  friction rub.   No murmur heard. Pulmonary/Chest: Effort normal and breath sounds normal. No respiratory distress. She has no wheezes. She has no rales. She exhibits no tenderness.  Abdominal: Soft. Bowel sounds are normal. She exhibits no distension and no mass. There is no tenderness. There is no rebound and no guarding.  Musculoskeletal: Normal range of motion. She exhibits no edema and no tenderness.  Lymphadenopathy:    She has no cervical adenopathy.  Neurological: She is alert and oriented to person, place, and time. She has normal reflexes. No cranial nerve deficit. She exhibits normal muscle tone. Coordination normal.  Skin: Skin is warm and dry. No rash noted. She is not diaphoretic. No erythema. No pallor.  Psychiatric: She has a normal mood and affect. Her behavior is normal. Judgment and thought content normal.            Assessment & Plan:

## 2011-11-09 NOTE — Patient Instructions (Signed)
Cough is from sinus drainage, possible acid reflux, and  asthma  The Methacholine challenge test shows asthma and also likely vocal cord irritation Glad cough has resolved and Brandy Andrade speech therapist made  A  Big difference for you  #Sinus drainage - glad this is resolved  - continue netti pot do few times a week  then back off and then see how it goes daily. Use bottled or distilled water.  -  Continue  nasal steroid generic fluticasone inhaler 1 squirts each nostril daily as advised through summer and then back off  #Possible Acid Reflux  - continue otc ranitidine 300mg at night but you can back this off slowly depending on your diet   - continue diet sheet - avoid colas, spices, cheeses, spirits, red meats, beer, chocolates, fried foods etc.,   - sleep with head end of bed elevated  - eat small frequent meals - depending on course might need barium swallow given difficulty with tablets  - do not go to bed for 3 hours after last meal -  #e Asthma  - continue  qvar 80mcg 2 puff twice daily but use with spacer  - alpha 1 check today   #Cyclical cough  - glad Brandy Andrade speech therapist helped you a lot  -  at all times there  there is urge to cough, drink water or swallow or sip on throat lozenge  #Followup - I will see you in 9 months - any problems call or come sooner - cough score at followup 

## 2011-11-10 ENCOUNTER — Encounter: Payer: Self-pay | Admitting: Internal Medicine

## 2011-11-10 NOTE — Assessment & Plan Note (Signed)
Cough is from sinus drainage, possible acid reflux, and  asthma  The Methacholine challenge test shows asthma and also likely vocal cord irritation Glad cough has resolved and [name] speech therapist made  A  Big difference for you  #Sinus drainage - glad this is resolved  - continue netti pot do few times a week  then back off and then see how it goes daily. Use bottled or distilled water.  -  Continue  nasal steroid generic fluticasone inhaler 1 squirts each nostril daily as advised through summer and then back off  #Possible Acid Reflux  - continue otc ranitidine 300mg  at night but you can back this off slowly depending on your diet   - continue diet sheet - avoid colas, spices, cheeses, spirits, red meats, beer, chocolates, fried foods etc.,   - sleep with head end of bed elevated  - eat small frequent meals - depending on course might need barium swallow given difficulty with tablets  - do not go to bed for 3 hours after last meal -  #e Asthma  - continue  qvar 2 puff twice daily but use with spacer  - alpha 1 check today   #Cyclical cough  - glad Mr Brandy Andrade speech therapist helped you a lot  -  at all times there  there is urge to cough, drink water or swallow or sip on throat lozenge  #Followup - I will see you in 9 months - any problems call or come sooner - cough score at followup

## 2011-12-03 ENCOUNTER — Telehealth: Payer: Self-pay | Admitting: Internal Medicine

## 2011-12-03 NOTE — Telephone Encounter (Signed)
Alpha ` 11/09/11 - MM and normal

## 2011-12-06 ENCOUNTER — Telehealth: Payer: Self-pay | Admitting: Internal Medicine

## 2011-12-06 NOTE — Telephone Encounter (Signed)
Alpha 1 MM 11/09/11 normal

## 2011-12-13 ENCOUNTER — Encounter: Payer: Self-pay | Admitting: Internal Medicine

## 2011-12-18 ENCOUNTER — Telehealth: Payer: Self-pay | Admitting: Internal Medicine

## 2011-12-18 NOTE — Telephone Encounter (Signed)
Called, spoke with pt who is requesting the results from the Alpha 1 test.  Per Phone msg from 12/06/11: RAMASWAMY,MURALI, MD 12/06/2011 1:27 PM Signed  Alpha 1 MM 11/09/11 normal  --  Informed pt that the Alpha 1 test was normal.  She verbalized understanding of this and voiced no further questions/concerns at this time.

## 2012-03-13 ENCOUNTER — Encounter (INDEPENDENT_AMBULATORY_CARE_PROVIDER_SITE_OTHER): Payer: Self-pay

## 2012-03-21 ENCOUNTER — Encounter: Payer: Self-pay | Admitting: Internal Medicine

## 2012-04-08 ENCOUNTER — Other Ambulatory Visit: Payer: Self-pay | Admitting: Internal Medicine

## 2012-05-15 ENCOUNTER — Other Ambulatory Visit: Payer: Self-pay | Admitting: Internal Medicine

## 2012-08-11 ENCOUNTER — Ambulatory Visit (INDEPENDENT_AMBULATORY_CARE_PROVIDER_SITE_OTHER): Payer: Medicare Other | Admitting: Internal Medicine

## 2012-08-11 VITALS — BP 124/78 | HR 79 | Temp 98.2°F | Ht 59.5 in | Wt 151.2 lb

## 2012-08-11 DIAGNOSIS — R05 Cough: Secondary | ICD-10-CM

## 2012-08-11 DIAGNOSIS — R059 Cough, unspecified: Secondary | ICD-10-CM

## 2012-08-11 NOTE — Progress Notes (Signed)
Subjective:    Patient ID: Brandy Andrade, female    DOB: [birth year], 78 y.o.   MRN: [id number]  HPI IOV 08/22/2011 78 year old  Body mass index is 30.90 kg/(m^2).  reports that she has never smoked. She does not have any smokeless tobacco history on file. Referred by Dr Elberta Spaniel for chronic cough  Reports cough since October 2012. Insidious onset. Initially thought it was allergy. She remembers abrupt onset when she was pulling weeds and since then cough never subsided. Mostly dry in quality but past few days productive of clear sputum that she thinks is due to increased sinus drainage. Rates cough as severe. She reports associated wheeze for first 1 month but this is resolved. Stable since onset. She denies tickle in throat normally other than today due to sinus drainage increase past few days. Does not normally gag but sometimes when cough is worse she gags.  Denies associated dyspnea, hemoptysis, fever, URI, flu symptoms, chest pains or clearing of throat. She is finding it socially embarrassing.   RSI cough score 10 (leve 5 - annoying cough, Leve 1 - hoarseness, clearing of throat, post nasal drip dificulty with tablets, and sensation of something in throat)   She is not on ACE inhibitors but on ARB losartan since 2003. She has hx of actonel making cough worse similar to current episode and since then trouble with tablets +. Cough resolved on stopping actonel within 2 weeks. Denies GERD. Denies chronic sinus drainage other than spring and fall and  during periods of low pressure in atmosphere past few days acting up. However, she denies sinus is making cough worse. Denies asthma diagnosis. Recollect 2 x normal cxr since oct 2012. REcollects trial of nexium Aug 05, 2010 but stopped after a week due to diarrhea. REcollects H2 blockade trial an ICS trial in nov 2012 short term without relief.     CT sinus 11/29/2003: no evidence of acute sinusitis per report (PATIENT INSISTS THIS WAS HER  HUSBAND'S SCAN OF SINUS AND IS AN ERROR IN EMR)  REC Cough is from sinus drainage, possible acid reflux, and possible asthma and possibly due to bp drug losartan    OV 09/19/2011  Cough followup. Subjectively better significantly and she is pleased with results. Objectively, RSI cough score is unchanged. It is still 10. Level 3 - difficulty swallowing tablets. Level 2 - clearing of throat, and toublesome cough. Level 1 - hoarseness of voice, post nasal drip, and sensation of something in throat. I questioned her on the subjective and objective discrepancy; she maintains she is vastly improved from prior OV. Test restuls shows Methacholine challenge 08/31/11: positive for asthma vs vocal cord irritation. THere is no insp loop flattening but expiration has cough  Past, Family, Social reviewed: multiple family members with asthma / copd   Current outpatient prescriptions:beclomethasone (QVAR) 80 MCG/ACT inhaler, Inhale 2 puffs into the lungs 2 (two) times daily., Disp: , Rfl: ;  CRESTOR 10 MG tablet, Take 1 tablet by mouth Daily., Disp: , Rfl: ;  fluticasone (FLONASE) 50 MCG/ACT nasal spray, Place 2 sprays into the nose daily., Disp: 16 g, Rfl: 6;  latanoprost (XALATAN) 0.005 % ophthalmic solution, Daily., Disp: , Rfl:  losartan-hydrochlorothiazide (HYZAAR) 50-12.5 MG per tablet, Take 1 tablet by mouth daily., Disp: , Rfl: ;  ranitidine (ZANTAC) 300 MG capsule, Take 300 mg by mouth daily., Disp: , Rfl:    Cough is from sinus drainage, possible acid reflux, and asthma  The Methacholine challenge test  shows asthma and also likely vocal cord irritation  Glad you are much improved    OV 11/09/2011  Fu cough. Cough all resolved. RSi cough score is only 1 - for post nasal drip; otherwise all else 0. She has some sinus drainage due to pollen and flowers around the house that makes her cough. But she feels other than that completely well. Feels [name] made world of difference for her. Very compliant  with ssinus, gerd and asthma Rx. No new issues. Pulse ox at rest on RA 92%. Walking desaturation 185 feet x 3 laps:  95% to 97% and did not desaturate  Past, Family, Social reviewed: no change since last visit other than her husband had knee replacement 2 days ago   REC Cough is from sinus drainage, possible acid reflux, and asthma  The Methacholine challenge test shows asthma and also likely vocal cord irritation  Glad cough has resolved and [name] speech therapist made A Big difference for you  #Sinus drainage - glad this is resolved  - continue netti pot do few times a week then back off and then see how it goes daily. Use bottled or distilled water.  - Continue nasal steroid generic fluticasone inhaler 1 squirts each nostril daily as advised through summer and then back off  #Possible Acid Reflux  - continue otc ranitidine 300mg  at night but you can back this off slowly depending on your diet  - continue diet sheet - avoid colas, spices, cheeses, spirits, red meats, beer, chocolates, fried foods etc.,  - sleep with head end of bed elevated  - eat small frequent meals  - depending on course might need barium swallow given difficulty with tablets  - do not go to bed for 3 hours after last meal  -  #e Asthma  - continue qvar 2 puff twice daily but use with spacer  - alpha 1 check today  - NORMAL #Cyclical cough  - glad Mr Verdie Mosher speech therapist helped you a lot  - at all times there there is urge to cough, drink water or swallow or sip on throat lozenge  #Followup  - I will see you in 9 months  - any problems call or come sooner  - cough score at followup    OV 08/11/2012  - Followup chronic cough. Her cough is resolved. RSI cough score is 1. cough differentiator score is 0 for acid reflux in 0 neurogenic or airway related cough issues. She only uses her nasal steroids occasionally. She does not use the ranitidine anymore. She is compliant with her Qvar. She wonders  if she truly has asthma because methacholine challenge test showed both asthma and vocal cord dysfunction. There no new issues   Review of Systems  Constitutional: Negative for fever and unexpected weight change.  HENT: Negative for ear pain, nosebleeds, congestion, sore throat, rhinorrhea, sneezing, trouble swallowing, dental problem, postnasal drip and sinus pressure.   Eyes: Negative for redness and itching.  Respiratory: Negative for cough, chest tightness, shortness of breath and wheezing.   Cardiovascular: Negative for palpitations and leg swelling.  Gastrointestinal: Negative for nausea and vomiting.  Genitourinary: Negative for dysuria.  Musculoskeletal: Negative for joint swelling.  Skin: Negative for rash.  Neurological: Negative for headaches.  Hematological: Does not bruise/bleed easily.  Psychiatric/Behavioral: Negative for dysphoric mood. The patient is not nervous/anxious.    Current outpatient prescriptions:CRESTOR 10 MG tablet, Take 1 tablet by mouth Daily., Disp: , Rfl: ;  fluticasone (  FLONASE) 50 MCG/ACT nasal spray, Place 2 sprays into the nose as needed., Disp: , Rfl: ;  latanoprost (XALATAN) 0.005 % ophthalmic solution, Daily., Disp: , Rfl: ;  losartan-hydrochlorothiazide (HYZAAR) 50-12.5 MG per tablet, Take 1 tablet by mouth daily., Disp: , Rfl:  QVAR 80 MCG/ACT inhaler, INHALE 2 PUFFS INTO THE LUNGS TWICE DAILY, Disp: 8.7 g, Rfl: 2     Objective:   Physical Exam Vitals reviewed. Constitutional: She is oriented to person, place, and time. She appears well-developed and well-nourished. No distress.  Body mass index is 30.04 kg/(m^2).  HENT:  Head: Normocephalic and atraumatic.  Right Ear: External ear normal.  Left Ear: External ear normal.  Mouth/Throat: Oropharynx is clear and moist. No oropharyngeal exudate.       no postnasal drip Eyes: Conjunctivae and EOM are normal. Pupils are equal, round, and reactive to light. Right eye exhibits no discharge. Left eye  exhibits no discharge. No scleral icterus.  Neck: Normal range of motion. Neck supple. No JVD present. No tracheal deviation present. No thyromegaly present.  Cardiovascular: Normal rate, regular rhythm, normal heart sounds and intact distal pulses.  Exam reveals no gallop and no friction rub.   No murmur heard. Pulmonary/Chest: Effort normal and breath sounds normal. No respiratory distress. She has no wheezes. She has no rales. She exhibits no tenderness.  Abdominal: Soft. Bowel sounds are normal. She exhibits no distension and no mass. There is no tenderness. There is no rebound and no guarding.  Musculoskeletal: Normal range of motion. She exhibits no edema and no tenderness.  Lymphadenopathy:    She has no cervical adenopathy.  Neurological: She is alert and oriented to person, place, and time. She has normal reflexes. No cranial nerve deficit. She exhibits normal muscle tone. Coordination normal.  Skin: Skin is warm and dry. No rash noted. She is not diaphoretic. No erythema. No pallor.  Psychiatric: She has a normal mood and affect. Her behavior is normal. Judgment and thought content normal.          Assessment & Plan:

## 2012-08-11 NOTE — Patient Instructions (Addendum)
Cough is from sinus drainage, possible acid reflux, and  asthma  The Methacholine challenge test shows asthma and also likely vocal cord irritation  #Sinus drainage - glad this is resolved  - Nettie pot and nasal steroids as needed  #Possible Acid Reflux  - Glad you do not think acid reflux is an issue, you can do ranitidine as needed    #e Asthma  -Continue Qvar but reduce it to one puff twice a day   #Cyclical cough  - Glad you attended speech therapy -  at all times there  there is urge to cough, drink water or swallow or sip on throat lozenge  #Followup - I will see you in 12 months -At followup we can try to check exhaled nitric oxide to see if you will continue to need Qvar (hopefully by then we'll have the equipment]

## 2012-08-14 ENCOUNTER — Encounter: Payer: Self-pay | Admitting: Internal Medicine

## 2012-08-14 NOTE — Assessment & Plan Note (Signed)
Cough is from sinus drainage, possible acid reflux, and  asthma  The Methacholine challenge test shows asthma and also likely vocal cord irritation  #Sinus drainage - glad this is resolved  - Nettie pot and nasal steroids as needed  #Possible Acid Reflux  - Glad you do not think acid reflux is an issue, you can do ranitidine as needed    #e Asthma  -Continue Qvar but reduce it to one puff twice a day   #Cyclical cough  - Glad you attended speech therapy -  at all times there  there is urge to cough, drink water or swallow or sip on throat lozenge  #Followup - I will see you in 12 months -At followup we can try to check exhaled nitric oxide to see if you will continue to need Qvar (hopefully by then we'll have the equipment] 

## 2012-08-15 ENCOUNTER — Encounter: Payer: Self-pay | Admitting: Obstetrics & Gynecology

## 2012-08-15 ENCOUNTER — Other Ambulatory Visit: Payer: Self-pay | Admitting: *Deleted

## 2012-08-15 MED ORDER — BECLOMETHASONE DIPROPIONATE 80 MCG/ACT IN AERS: 2.0000 | INHALATION_SPRAY | Freq: Two times a day (BID) | RESPIRATORY_TRACT | Status: DC

## 2012-09-22 ENCOUNTER — Ambulatory Visit (INDEPENDENT_AMBULATORY_CARE_PROVIDER_SITE_OTHER): Payer: Medicare Other | Admitting: Obstetrics & Gynecology

## 2012-09-22 ENCOUNTER — Encounter: Payer: Self-pay | Admitting: Obstetrics & Gynecology

## 2012-09-22 VITALS — BP 136/82 | Ht 60.0 in | Wt 152.2 lb

## 2012-09-22 DIAGNOSIS — J45909 Unspecified asthma, uncomplicated: Secondary | ICD-10-CM | POA: Insufficient documentation

## 2012-09-22 DIAGNOSIS — Z01419 Encounter for gynecological examination (general) (routine) without abnormal findings: Secondary | ICD-10-CM

## 2012-09-22 NOTE — Progress Notes (Signed)
78 y.o. G2P2 MarriedCaucasianF here for annual exam. Sees Dr. Wylene Simmer every six months.  Last visit was Apr 04, 2012.  Doing really well.  Another cousin (on father's side) diagnosed with breast cancer.  She did genetic testing which was negative.  This makes four cousins and one aunt with breast cancer.  No LMP recorded. Patient has had a hysterectomy.          Sexually active: yes  The current method of family planning is status post hysterectomy.    Exercising: yes  walking Smoker:  no  Health Maintenance: Pap:  Per patient 2006.  History of hysterectomy. MMG:  03/12/12 3D normal Colonoscopy:  2004.  Due later this year.  Has gotten letter from [employer]. BMD:   07/23/11 stable TDaP:  2008 Has taken the pneumovax and zostavax vaccines.   reports that she has never smoked. She does not have any smokeless tobacco history on file. She reports that she does not drink alcohol or use illicit drugs.  Past Medical History  Diagnosis Date  . Hyperlipidemia   . Glaucoma(365)   . Osteopenia   . Post-nasal drainage   . Cough   . Hypertension 11/09  . Osteoporosis   . Compression fracture of spine 01/08  . Breast cancer     lumpectomy and radiation (lobular breast ca)  . Asthma in adult     adult onset    Past Surgical History  Procedure Laterality Date  . Total abdominal hysterectomy    . Cesarean section    . Cholecystectomy    . Tonsillectomy and adenoidectomy    . Breast lumpectomy  2004    Current Outpatient Prescriptions  Medication Sig Dispense Refill  . aspirin 81 MG tablet Take 81 mg by mouth daily.      . beclomethasone (QVAR) 80 MCG/ACT inhaler Inhale 2 puffs into the lungs 2 (two) times daily.  8.7 g  5  . Calcium Carbonate-Vitamin D (CALCIUM + D PO) Take 1,200 mg by mouth.      . Cholecalciferol (VITAMIN D PO) Take 1,200 Int'l Units by mouth daily.      . CRESTOR 10 MG tablet Take 1 tablet by mouth Daily.      Marland Kitchen latanoprost (XALATAN) 0.005 % ophthalmic solution Daily.       Marland Kitchen losartan-hydrochlorothiazide (HYZAAR) 50-12.5 MG per tablet Take 1 tablet by mouth daily.      . fluticasone (FLONASE) 50 MCG/ACT nasal spray Place 2 sprays into the nose as needed.       No current facility-administered medications for this visit.    Family History  Problem Relation Age of Onset  . Heart attack Mother   . Diabetes Mother   . Hypertension Mother   . Breast cancer      several aunts  . Emphysema Sister   . Cancer Sister     uterine and cervical  . Cancer Father     lung  . Thyroid disease Father     low thyroid dysfunction    ROS:  Pertinent items are noted in HPI.  Otherwise, a comprehensive ROS was negative.  Exam:   BP 136/82  Ht 5' (1.524 m)  Wt 152 lb 3.2 oz (69.037 kg)  BMI 29.72 kg/m2  Weight change: @WEIGHTCHANGE @ Height:   Height: 5' (152.4 cm)  Ht Readings from Last 3 Encounters:  09/22/12 5' (1.524 m)  08/11/12 4' 11.5" (1.511 m)  11/09/11 4\' 11"  (1.499 m)    General appearance: alert, cooperative  and appears stated age Head: Normocephalic, without obvious abnormality, atraumatic Neck: no adenopathy, supple, symmetrical, trachea midline and thyroid not enlarged, symmetric, no tenderness/mass/nodules Lungs: clear to auscultation bilaterally Breasts: Inspection negative, No nipple retraction or dimpling, No nipple discharge or bleeding, No axillary or supraclavicular adenopathy, Normal to palpation without dominant masses.  Two scars left breast with radiation changes.  No change since last exam. Heart: regular rate and rhythm Abdomen: soft, non-tender; bowel sounds normal; no masses,  no organomegaly Extremities: extremities normal, atraumatic, no cyanosis or edema Skin: Skin color, texture, turgor normal. No rashes or lesions Lymph nodes: Cervical, supraclavicular, and axillary nodes normal. No abnormal inguinal nodes palpated Neurologic: Grossly normal   Pelvic: External genitalia:  no lesions              Urethra:  normal  appearing urethra with no masses, tenderness or lesions              Bartholins and Skenes: normal                 Vagina: normal appearing vagina with normal color and discharge, no lesions              Cervix: absent              Pap taken: no Bimanual Exam:  Uterus:  uterus absent              Adnexa: no mass, fullness, tenderness               Rectovaginal: Confirms               Anus:  normal sphincter tone, no lesions  A:  Well Woman with normal exam H/O lobar breast cancer 2004 (Stage I invasive) Vaginal atrophy htn Elevated lipids Adult asthma Osteopenia Glaucoma  P:   Mammogram yearly, doing 3D Labs with Dr. Wylene Simmer yearly (10/13 in media tab) BMD due 1/15 and she will schedule return annually or prn  An After Visit Summary was printed and given to the patient.

## 2012-09-22 NOTE — Patient Instructions (Signed)

## 2013-01-22 ENCOUNTER — Encounter: Payer: Self-pay | Admitting: Internal Medicine

## 2013-03-19 ENCOUNTER — Ambulatory Visit (AMBULATORY_SURGERY_CENTER): Payer: Self-pay | Admitting: *Deleted

## 2013-03-19 ENCOUNTER — Telehealth: Payer: Self-pay | Admitting: *Deleted

## 2013-03-19 VITALS — Ht 59.5 in | Wt 150.8 lb

## 2013-03-19 DIAGNOSIS — Z1211 Encounter for screening for malignant neoplasm of colon: Secondary | ICD-10-CM

## 2013-03-19 MED ORDER — NA SULFATE-K SULFATE-MG SULF 17.5-3.13-1.6 GM/177ML PO SOLN: 1.0000 | Freq: Once | ORAL | Status: DC

## 2013-03-19 NOTE — Telephone Encounter (Signed)
Olegario Messier,  Thank you, I spoke with this nice lady, answered her questions , and assured her.  Best regards,  Jonny Ruiz

## 2013-03-19 NOTE — Telephone Encounter (Signed)
John:  Pt is scheduled for colonoscopy with Dr. Leone Payor on Thursday 10/9.  She had surgery in 1959 and had prolonged wake up time from anesthesia.  She has been fearful of being put to sleep since then.  She had breast lumpectomy in 2004 with no problems with anesthesia.  She was satisfied with my explanation of propofol used as sedation.  I still think she would benefit from talking to you before her procedure.  Her telephone number is: [phone].  Thanks, Olegario Messier

## 2013-03-24 ENCOUNTER — Encounter: Payer: Self-pay | Admitting: Internal Medicine

## 2013-04-02 ENCOUNTER — Ambulatory Visit (AMBULATORY_SURGERY_CENTER): Payer: Medicare Other | Admitting: Internal Medicine

## 2013-04-02 ENCOUNTER — Encounter: Payer: Self-pay | Admitting: Internal Medicine

## 2013-04-02 VITALS — BP 157/84 | HR 87 | Temp 97.2°F | Resp 28 | Ht 59.5 in | Wt 150.0 lb

## 2013-04-02 DIAGNOSIS — D126 Benign neoplasm of colon, unspecified: Secondary | ICD-10-CM

## 2013-04-02 DIAGNOSIS — Z1211 Encounter for screening for malignant neoplasm of colon: Secondary | ICD-10-CM

## 2013-04-02 MED ORDER — SODIUM CHLORIDE 0.9 % IV SOLN: 500.0000 mL | INTRAVENOUS | Status: DC

## 2013-04-02 NOTE — Progress Notes (Signed)
No complaints noted in the recovery room. Maw   

## 2013-04-02 NOTE — Patient Instructions (Addendum)
I found and removed 3 tiny polyps. You have diverticulosis and hemorrhoids also.  The polyps are TINY and benign I think. Should be your last routine colonoscopy.  I will let you know pathology results by mail.   I appreciate the opportunity to care for you. Iva Boop, MD, [facility]    You may resume your current medications today. Handouts were given to your care partner on diverticulosis, hemorrhoids and polyps. Please call if any questions or concerns.    YOU HAD AN ENDOSCOPIC PROCEDURE TODAY AT THE [facility]: Refer to the procedure report that was given to you for any specific questions about what was found during the examination.  If the procedure report does not answer your questions, please call your gastroenterologist to clarify.  If you requested that your care partner not be given the details of your procedure findings, then the procedure report has been included in a sealed envelope for you to review at your convenience later.  YOU SHOULD EXPECT: Some feelings of bloating in the abdomen. Passage of more gas than usual.  Walking can help get rid of the air that was put into your GI tract during the procedure and reduce the bloating. If you had a lower endoscopy (such as a colonoscopy or flexible sigmoidoscopy) you may notice spotting of blood in your stool or on the toilet paper. If you underwent a bowel prep for your procedure, then you may not have a normal bowel movement for a few days.  DIET: Your first meal following the procedure should be a light meal and then it is ok to progress to your normal diet.  A half-sandwich or bowl of soup is an example of a good first meal.  Heavy or fried foods are harder to digest and may make you feel nauseous or bloated.  Likewise meals heavy in dairy and vegetables can cause extra gas to form and this can also increase the bloating.  Drink plenty of fluids but you should avoid alcoholic beverages for 24 hours.  ACTIVITY: Your  care partner should take you home directly after the procedure.  You should plan to take it easy, moving slowly for the rest of the day.  You can resume normal activity the day after the procedure however you should NOT DRIVE or use heavy machinery for 24 hours (because of the sedation medicines used during the test).    SYMPTOMS TO REPORT IMMEDIATELY: A gastroenterologist can be reached at any hour.  During normal business hours, 8:30 AM to 5:00 PM Monday through Friday, call [phone].  After hours and on weekends, please call the GI answering service at [phone] who will take a message and have the physician on call contact you.   Following lower endoscopy (colonoscopy or flexible sigmoidoscopy):  Excessive amounts of blood in the stool  Significant tenderness or worsening of abdominal pains  Swelling of the abdomen that is new, acute  Fever of 100F or higher   FOLLOW UP: If any biopsies were taken you will be contacted by phone or by letter within the next 1-3 weeks.  Call your gastroenterologist if you have not heard about the biopsies in 3 weeks.  Our staff will call the home number listed on your records the next business day following your procedure to check on you and address any questions or concerns that you may have at that time regarding the information given to you following your procedure. This is a courtesy call and so  if there is no answer at the home number and we have not heard from you through the emergency physician on call, we will assume that you have returned to your regular daily activities without incident.  SIGNATURES/CONFIDENTIALITY: You and/or your care partner have signed paperwork which will be entered into your electronic medical record.  These signatures attest to the fact that that the information above on your After Visit Summary has been reviewed and is understood.  Full responsibility of the confidentiality of this discharge information lies with you  and/or your care-partner.

## 2013-04-02 NOTE — Progress Notes (Signed)
Patient did not experience any of the following events: a burn prior to discharge; a fall within the facility; wrong site/side/patient/procedure/implant event; or a hospital transfer or hospital admission upon discharge from the facility. (G8907) Patient did not have preoperative order for IV antibiotic SSI prophylaxis. (G8918)  

## 2013-04-02 NOTE — Progress Notes (Signed)
Report given to Alease Frame, RN to finish care of pt. Brandy Andrade

## 2013-04-02 NOTE — Progress Notes (Signed)
Lidocaine-40mg IV prior to Propofol InductionPropofol given over incremental dosages 

## 2013-04-02 NOTE — Progress Notes (Signed)
Called to room to assist during endoscopic procedure.  Patient ID and intended procedure confirmed with present staff. Received instructions for my participation in the procedure from the performing physician.  

## 2013-04-02 NOTE — Op Note (Signed)
[facility] [address] [place], [address]   COLONOSCOPY PROCEDURE REPORT  PATIENT: Brandy Andrade, Brandy Andrade  MR#: [id number] BIRTHDATE: 02-03-1935 , 78  yrs. old GENDER: Female ENDOSCOPIST: Iva Boop, MD, [facility] PROCEDURE DATE:  04/02/2013 PROCEDURE:   Colonoscopy with biopsy First Screening Colonoscopy - Avg.  risk and is 50 yrs.  old or older - No.  Prior Negative Screening - Now for repeat screening. 10 or more years since last screening  History of Adenoma - Now for follow-up colonoscopy & has been > or = to 3 yrs.  N/A  Polyps Removed Today? Yes. ASA CLASS:   Class II INDICATIONS:average risk screening and Last colonoscopy performed 10 years ago. MEDICATIONS: Propofol (Diprivan) 120 mg IV, MAC sedation, administered by CRNA, and These medications were titrated to patient response per physician's verbal order  DESCRIPTION OF PROCEDURE:   After the risks benefits and alternatives of the procedure were thoroughly explained, informed consent was obtained.  A digital rectal exam revealed external thrombosed hemorrhoids.   The [id number]  endoscope was introduced through the anus and advanced to the cecum, which was identified by both the appendix and ileocecal valve. No adverse events experienced.   The quality of the prep was excellent using Suprep  The instrument was then slowly withdrawn as the colon was fully examined.   COLON FINDINGS: Three diminutive sessile polyps were found in the ascending colon and transverse colon.  A polypectomy was performed with cold forceps.  The resection was complete and the polyp tissue was completely retrieved.   There was severe diverticulosis noted in the sigmoid colon with associated angulation and luminal narrowing.   The colon mucosa was otherwise normal.  Retroflexed views revealed no abnormalities. The time to cecum=6 minutes 40 seconds.  Withdrawal time=9 minutes 04 seconds.  The scope was withdrawn and  the procedure completed. COMPLICATIONS: There were no complications.  ENDOSCOPIC IMPRESSION: 1.   Three diminutive sessile polyps were found in the ascending colon and transverse colon; polypectomy was performed with cold forceps 2.   There was severe diverticulosis noted in the sigmoid colon 3.   The colon mucosa was otherwise normal  RECOMMENDATIONS: Should not need further routine colonoscopy or screening hemoccults. I will notify re: pathology results.  eSigned:  Iva Boop, MD, [facility] 04/02/2013 11:41 AM  cc: Guerry Bruin, MD and The Patient

## 2013-04-02 NOTE — Progress Notes (Signed)
Patient states that she has had problems with anesthesia in the past.  She just doesn't wake up well, and she is very nervous.  I tried to reassure her.

## 2013-04-03 ENCOUNTER — Telehealth: Payer: Self-pay | Admitting: *Deleted

## 2013-04-03 NOTE — Telephone Encounter (Signed)
  Follow up Call-  Call back number 04/02/2013  Post procedure Call Back phone  # [phone]  Permission to leave phone message Yes     Patient questions:  Do you have a fever, pain , or abdominal swelling? no Pain Score  0 *  Have you tolerated food without any problems? yes  Have you been able to return to your normal activities? yes  Do you have any questions about your discharge instructions: Diet   no Medications  no Follow up visit  no  Do you have questions or concerns about your Care? no  Actions: * If pain score is 4 or above: No action needed, pain <4.

## 2013-04-10 ENCOUNTER — Encounter: Payer: Self-pay | Admitting: Internal Medicine

## 2013-04-10 DIAGNOSIS — Z860100 Personal history of colon polyps, unspecified: Secondary | ICD-10-CM

## 2013-04-10 HISTORY — DX: Personal history of colonic polyps: Z86.010

## 2013-04-10 HISTORY — DX: Personal history of colon polyps, unspecified: Z86.0100

## 2013-04-10 NOTE — Progress Notes (Signed)
Quick Note:  2 diminutive adenomas Repeat colon 2017 ______

## 2013-04-10 NOTE — Progress Notes (Signed)
Quick Note:  Correction - routine repeat colonoscopy not needed (age) ______

## 2013-07-22 ENCOUNTER — Encounter: Payer: Self-pay | Admitting: Obstetrics & Gynecology

## 2013-08-06 ENCOUNTER — Telehealth: Payer: Self-pay | Admitting: Obstetrics & Gynecology

## 2013-08-06 NOTE — Telephone Encounter (Signed)
Left message for patient to see about moving up her consult appointment from 08/31/13 to 08/07/13 at 1:00 with Dr. Haze Boyden  FYI-Cc: Claiborne Billings

## 2013-08-07 ENCOUNTER — Encounter: Payer: Self-pay | Admitting: Obstetrics & Gynecology

## 2013-08-07 ENCOUNTER — Ambulatory Visit (INDEPENDENT_AMBULATORY_CARE_PROVIDER_SITE_OTHER): Payer: Medicare Other | Admitting: Obstetrics & Gynecology

## 2013-08-07 VITALS — BP 136/90 | HR 73 | Resp 18 | Ht 59.0 in | Wt 150.0 lb

## 2013-08-07 DIAGNOSIS — M810 Age-related osteoporosis without current pathological fracture: Secondary | ICD-10-CM | POA: Insufficient documentation

## 2013-08-07 NOTE — Progress Notes (Signed)
Patient ID: Brandy Andrade, female   DOB: 03-Oct-1934, 79 y.o.   MRN: [id number]  Subjective:    81 yrs Married Caucasian G2P2  female here to discuss recent BMD obtained 07/23/13 showing osteoporosis in her spine and ostoepenia in hips.  Patient has been on Fosamax, Actonel, and Boniva in the past with reflux and swallowing issues.  Prior BMD obtained 07/23/11 showed T score -2.1 in spine and -2.2 at neck of right femur.       Osteoporosis Risk Factors  Nonmodifiable Personal Hx of fracture as an adult: no Hx of fracture in first-degree relative: no but osteoporosis in several female family members Caucasian race: yes Advanced age: yes Female sex: yes Dementia: no Poor health/frailty: no  Potentially modifiable: Tobacco use: no Low body weight (<127 lbs): no Estrogen deficiency  early menopause (age <45) or bilateral ovariectomy: no  prolonged premenopausal amenorrhea (>1 yr): no    History of breast cancer with Femara use Low calcium intake (lifelong): no Alcohol use more than 2 drinks per day: no Recurrent falls: no Inadequate physical activity: yes  Current calcium and Vit D intake:  Review of Systems A comprehensive review of systems was negative.     Objective:   PHYSICAL EXAM BP 136/90  Pulse 73  Resp 18  Ht 4\' 11"  (1.499 m)  Wt 150 lb (68.04 kg)  BMI 30.28 kg/m2 General appearance: alert and cooperative  Imaging Bone Density: Spine T Score: -2.5, Hip T Score: -2.3 (neck of right femur)  FRAX score:  10 year probability of hip fracture: 4.8 percent.                        10-year probability of major osteoporotic fractures combined is 15.9 percent.                                         Assessment:   Osteoporosis with T score -2.5 in spine   Plan:   1.  Patient counseled in adequate calcium and vitamin D exposure.  Calcium - 500 - 1000 mg elemental calcium/day in divided doses  Vitamin D - 800 IU/day  Told not to take at same time as  bisphosphonate. 2.  Exercise recommended at least 30 minutes 3 times per week.  3.  Fall prevention discussed. 4.  Pharmacologic therapy therapy below discussed including    risks and benefits:   Bisphosphonates po (Fosamax, Actonel, Boniva)  Bisphosphonate IV (Reclast)  Evista  Prolia subcutaneous  5.  Pt considering Reclast.  She will probably do this through Dr. Loren Racer office. 6.  Pt will check with pulmonologist about stopping steroid inhaler which she has been on for about two years. 7.  Repeat bone density in 2 years.  ~25 minutes spent with patient >50% of time was in face to face discussion of above.  Husband accompanied patient today.

## 2013-08-19 ENCOUNTER — Encounter: Payer: Self-pay | Admitting: Internal Medicine

## 2013-08-19 ENCOUNTER — Ambulatory Visit (INDEPENDENT_AMBULATORY_CARE_PROVIDER_SITE_OTHER): Payer: Medicare Other | Admitting: Internal Medicine

## 2013-08-19 VITALS — BP 128/82 | HR 78 | Ht 60.0 in | Wt 152.8 lb

## 2013-08-19 DIAGNOSIS — R059 Cough, unspecified: Secondary | ICD-10-CM

## 2013-08-19 DIAGNOSIS — R053 Chronic cough: Secondary | ICD-10-CM

## 2013-08-19 DIAGNOSIS — R05 Cough: Secondary | ICD-10-CM

## 2013-08-19 NOTE — Patient Instructions (Addendum)
Gald cough is gone Stop QVAR (at this low dose for 2 years, doubt it played a role in osteoporosis) Do pre-emptive allergy based treatment with flonase or anti-histamines ahead of season Can try QVAR during allergy season at this low dose  No further follwup but return if there is aproblem

## 2013-08-19 NOTE — Progress Notes (Signed)
Subjective:    Patient ID: Brandy Andrade, female    DOB: February 28, 1935, 79 y.o.   MRN: [id number]  HPI  IOV 08/22/2011 79 year old  Body mass index is 30.90 kg/(m^2).  reports that she has never smoked. She does not have any smokeless tobacco history on file. Referred by Dr Brandy Andrade for chronic cough  Reports cough since October 2012. Insidious onset. Initially thought it was allergy. She remembers abrupt onset when she was pulling weeds and since then cough never subsided. Mostly dry in quality but past few days productive of clear sputum that she thinks is due to increased sinus drainage. Rates cough as severe. She reports associated wheeze for first 1 month but this is resolved. Stable since onset. She denies tickle in throat normally other than today due to sinus drainage increase past few days. Does not normally gag but sometimes when cough is worse she gags.  Denies associated dyspnea, hemoptysis, fever, URI, flu symptoms, chest pains or clearing of throat. She is finding it socially embarrassing.   RSI cough score 10 (leve 5 - annoying cough, Leve 1 - hoarseness, clearing of throat, post nasal drip dificulty with tablets, and sensation of something in throat)   She is not on ACE inhibitors but on ARB losartan since 2003. She has hx of actonel making cough worse similar to current episode and since then trouble with tablets +. Cough resolved on stopping actonel within 2 weeks. Denies GERD. Denies chronic sinus drainage other than spring and fall and  during periods of low pressure in atmosphere past few days acting up. However, she denies sinus is making cough worse. Denies asthma diagnosis. Recollect 2 x normal cxr since oct 2012. REcollects trial of nexium Aug 05, 2010 but stopped after a week due to diarrhea. REcollects H2 blockade trial an ICS trial in nov 2012 short term without relief.     CT sinus 11/29/2003: no evidence of acute sinusitis per report (PATIENT INSISTS THIS WAS  HER HUSBAND'S SCAN OF SINUS AND IS AN ERROR IN EMR)  REC Cough is from sinus drainage, possible acid reflux, and possible asthma and possibly due to bp drug losartan    OV 09/19/2011  Cough followup. Subjectively better significantly and she is pleased with results. Objectively, RSI cough score is unchanged. It is still 10. Level 3 - difficulty swallowing tablets. Level 2 - clearing of throat, and toublesome cough. Level 1 - hoarseness of voice, post nasal drip, and sensation of something in throat. I questioned her on the subjective and objective discrepancy; she maintains she is vastly improved from prior OV. Test restuls shows Methacholine challenge 08/31/11: positive for asthma vs vocal cord irritation. THere is no insp loop flattening but expiration has cough  Past, Family, Social reviewed: multiple family members with asthma / copd   Current outpatient prescriptions:beclomethasone (QVAR) 80 MCG/ACT inhaler, Inhale 2 puffs into the lungs 2 (two) times daily., Disp: , Rfl: ;  CRESTOR 10 MG tablet, Take 1 tablet by mouth Daily., Disp: , Rfl: ;  fluticasone (FLONASE) 50 MCG/ACT nasal spray, Place 2 sprays into the nose daily., Disp: 16 g, Rfl: 6;  latanoprost (XALATAN) 0.005 % ophthalmic solution, Daily., Disp: , Rfl:  losartan-hydrochlorothiazide (HYZAAR) 50-12.5 MG per tablet, Take 1 tablet by mouth daily., Disp: , Rfl: ;  ranitidine (ZANTAC) 300 MG capsule, Take 300 mg by mouth daily., Disp: , Rfl:    Cough is from sinus drainage, possible acid reflux, and asthma  The Methacholine  challenge test shows asthma and also likely vocal cord irritation  Glad you are much improved    OV 11/09/2011  Fu cough. Cough all resolved. RSi cough score is only 1 - for post nasal drip; otherwise all else 0. She has some sinus drainage due to pollen and flowers around the house that makes her cough. But she feels other than that completely well. Feels Brandy Andrade made world of difference for her. Very  compliant with ssinus, gerd and asthma Rx. No new issues. Pulse ox at rest on RA 92%. Walking desaturation 185 feet x 3 laps:  95% to 97% and did not desaturate  Past, Family, Social reviewed: no change since last visit other than her husband had knee replacement 2 days ago   REC Cough is from sinus drainage, possible acid reflux, and asthma  The Methacholine challenge test shows asthma and also likely vocal cord irritation  Glad cough has resolved and Brandy Andrade speech therapist made A Big difference for you  #Sinus drainage - glad this is resolved  - continue netti pot do few times a week then back off and then see how it goes daily. Use bottled or distilled water.  - Continue nasal steroid generic fluticasone inhaler 1 squirts each nostril daily as advised through summer and then back off  #Possible Acid Reflux  - continue otc ranitidine 300mg  at night but you can back this off slowly depending on your diet  - continue diet sheet - avoid colas, spices, cheeses, spirits, red meats, beer, chocolates, fried foods etc.,  - sleep with head end of bed elevated  - eat small frequent meals  - depending on course might need barium swallow given difficulty with tablets  - do not go to bed for 3 hours after last meal  -  #e Asthma  - continue qvar 46mcg 2 puff twice daily but use with spacer  - alpha 1 check today  - NORMAL #Cyclical cough  - glad Brandy Andrade speech therapist helped you a lot  - at all times there there is urge to cough, drink water or swallow or sip on throat lozenge  #Followup  - I will see you in 9 months  - any problems call or come sooner  - cough score at followup    OV 08/11/2012  - Followup chronic cough. Her cough is resolved. RSI cough score is 1. cough differentiator score is 0 for acid reflux in 0 neurogenic or airway related cough issues. She only uses her nasal steroids occasionally. She does not use the ranitidine anymore. She is compliant with her Qvar.  She wonders if she truly has asthma because methacholine challenge test showed both asthma and vocal cord dysfunction. There no new issues   Cough is from sinus drainage, possible acid reflux, and  asthma  The Methacholine challenge test shows asthma and also likely vocal cord irritation  #Sinus drainage - glad this is resolved  - Nettie pot and nasal steroids as needed  #Possible Acid Reflux  - Glad you do not think acid reflux is an issue, you can do ranitidine as needed    #e Asthma  -Continue Qvar but reduce it to one puff twice a day   #Cyclical cough  - Glad you attended speech therapy -  at all times there  there is urge to cough, drink water or swallow or sip on throat lozenge  #Followup - I will see you in 12 months -At followup we can  try to check exhaled nitric oxide to see if you will continue to need Qvar (hopefully by then we'll have the equipment]   OV 08/19/2013  Chief Complaint  Patient presents with  . Follow-up    Pt states she is not having any cough and sob. She is concerned about the qvar causing loss in bone density.    1 a followup for cough. Cough is currently resolved. RSI cough score is only 4. Of this dysphagia for tablets is not really a problem. She is continued with Qvar 80 mcg 1 puff twice daily. However in the past year she's been diagnosed with osteoporosis in she's had a significant decline in bone density. So sure if she can come off Qvar. Cough is been stable and resolved. For the last month she's been taking Qvar one puff once daily and cough is not an issue  Dr Lorenza [place] Reflux Symptom Index (> 13-15 suggestive of LPR cough) 0 -> 5  =  none ->severe problem  Hoarseness of problem with voice 1  Clearing  Of Throat 0  Excess throat mucus or feeling of post nasal drip 1  Difficulty swallowing food, liquid or tablets 2 with BONIVA  Cough after eating or lying down 0  Breathing difficulties or choking episodes 0  Troublesome or annoying cough 0   Sensation of something sticking in throat or lump in throat 0  Heartburn, chest pain, indigestion, or stomach acid coming up 0  TOTAL 2     Review of Systems  Constitutional: Negative for fever and unexpected weight change.  HENT: Negative for congestion, dental problem, ear pain, nosebleeds, postnasal drip, rhinorrhea, sinus pressure, sneezing, sore throat and trouble swallowing.   Eyes: Negative for redness and itching.  Respiratory: Negative for cough, chest tightness, shortness of breath and wheezing.   Cardiovascular: Negative for palpitations and leg swelling.  Gastrointestinal: Negative for nausea and vomiting.  Genitourinary: Negative for dysuria.  Musculoskeletal: Negative for joint swelling.  Skin: Negative for rash.  Neurological: Negative for headaches.  Hematological: Does not bruise/bleed easily.  Psychiatric/Behavioral: Negative for dysphoric mood. The patient is not nervous/anxious.        Objective:   Physical Exam  Vitals reviewed. Constitutional: She is oriented to person, place, and time. She appears well-developed and well-nourished. No distress.  HENT:  Head: Normocephalic and atraumatic.  Right Ear: External ear normal.  Left Ear: External ear normal.  Mouth/Throat: Oropharynx is clear and moist. No oropharyngeal exudate.  Eyes: Conjunctivae and EOM are normal. Pupils are equal, round, and reactive to light. Right eye exhibits no discharge. Left eye exhibits no discharge. No scleral icterus.  Neck: Normal range of motion. Neck supple. No JVD present. No tracheal deviation present. No thyromegaly present.  Cardiovascular: Normal rate, regular rhythm, normal heart sounds and intact distal pulses.  Exam reveals no gallop and no friction rub.   No murmur heard. Pulmonary/Chest: Effort normal and breath sounds normal. No respiratory distress. She has no wheezes. She has no rales. She exhibits no tenderness.  Abdominal: Soft. Bowel sounds are normal. She  exhibits no distension and no mass. There is no tenderness. There is no rebound and no guarding.  Musculoskeletal: Normal range of motion. She exhibits no edema and no tenderness.  Lymphadenopathy:    She has no cervical adenopathy.  Neurological: She is alert and oriented to person, place, and time. She has normal reflexes. No cranial nerve deficit. She exhibits normal muscle tone. Coordination normal.  Skin: Skin is warm  and dry. No rash noted. She is not diaphoretic. No erythema. No pallor.  Psychiatric: She has a normal mood and affect. Her behavior is normal. Judgment and thought content normal.          Assessment & Plan:

## 2013-08-20 ENCOUNTER — Ambulatory Visit: Payer: Medicare Other | Admitting: Internal Medicine

## 2013-08-20 NOTE — Assessment & Plan Note (Signed)
Gald cough is gone Stop QVAR (at this low dose for 2 years, doubt it played a role in osteoporosis) Do pre-emptive allergy based treatment with flonase or anti-histamines ahead of season Can try QVAR during allergy season at this low dose  No further follwup but return if there is aproblem  > 50% of this > 25 min visit spent in face to face counseling (15 min visit converted to 25 min)

## 2013-08-31 ENCOUNTER — Institutional Professional Consult (permissible substitution): Payer: Medicare Other | Admitting: Obstetrics & Gynecology

## 2013-11-10 ENCOUNTER — Other Ambulatory Visit (HOSPITAL_COMMUNITY): Payer: Self-pay | Admitting: Internal Medicine

## 2013-11-10 ENCOUNTER — Encounter (HOSPITAL_COMMUNITY): Payer: Self-pay

## 2013-11-10 ENCOUNTER — Ambulatory Visit (HOSPITAL_COMMUNITY)
Admission: RE | Admit: 2013-11-10 | Discharge: 2013-11-10 | Disposition: A | Discharge status: Home / Self Care | Payer: Medicare Other | Source: Ambulatory Visit | Attending: Internal Medicine | Admitting: Internal Medicine

## 2013-11-10 DIAGNOSIS — M810 Age-related osteoporosis without current pathological fracture: Secondary | ICD-10-CM | POA: Insufficient documentation

## 2013-11-10 MED ORDER — ZOLEDRONIC ACID 5 MG/100ML IV SOLN
5.0000 mg | Freq: Once | INTRAVENOUS | Status: AC
  Administered 2013-11-10: 5 mg via INTRAVENOUS
  Filled 2013-11-10: qty 100

## 2013-11-10 MED ORDER — SODIUM CHLORIDE 0.9 % IV SOLN
INTRAVENOUS | Status: DC
  Administered 2013-11-10: 250 mL via INTRAVENOUS

## 2013-11-10 NOTE — Discharge Instructions (Signed)
Drink fluids/water as tolerated over next 72hrs °Tylenol or Ibuprofen OTC as directed °Continue calcium and Vit D as directed by your MDZoledronic Acid injection (Paget's Disease, Osteoporosis) °What is this medicine? °ZOLEDRONIC ACID (ZOE le dron ik AS id) lowers the amount of calcium loss from bone. It is used to treat Paget's disease and osteoporosis in women. °This medicine may be used for other purposes; ask your health care provider or pharmacist if you have questions. °COMMON BRAND NAME(S): Reclast, Zometa °What should I tell my health care provider before I take this medicine? °They need to know if you have any of these conditions: °-aspirin-sensitive asthma °-cancer, especially if you are receiving medicines used to treat cancer °-dental disease or wear dentures °-infection °-kidney disease °-low levels of calcium in the blood °-past surgery on the parathyroid gland or intestines °-receiving corticosteroids like dexamethasone or prednisone °-an unusual or allergic reaction to zoledronic acid, other medicines, foods, dyes, or preservatives °-pregnant or trying to get pregnant °-breast-feeding °How should I use this medicine? °This medicine is for infusion into a vein. It is given by a health care professional in a hospital or clinic setting. °Talk to your pediatrician regarding the use of this medicine in children. This medicine is not approved for use in children. °Overdosage: If you think you have taken too much of this medicine contact a poison control center or emergency room at once. °NOTE: This medicine is only for you. Do not share this medicine with others. °What if I miss a dose? °It is important not to miss your dose. Call your doctor or health care professional if you are unable to keep an appointment. °What may interact with this medicine? °-certain antibiotics given by injection °-NSAIDs, medicines for pain and inflammation, like ibuprofen or naproxen °-some diuretics like bumetanide,  furosemide °-teriparatide °This list may not describe all possible interactions. Give your health care provider a list of all the medicines, herbs, non-prescription drugs, or dietary supplements you use. Also tell them if you smoke, drink alcohol, or use illegal drugs. Some items may interact with your medicine. °What should I watch for while using this medicine? °Visit your doctor or health care professional for regular checkups. It may be some time before you see the benefit from this medicine. Do not stop taking your medicine unless your doctor tells you to. Your doctor may order blood tests or other tests to see how you are doing. °Women should inform their doctor if they wish to become pregnant or think they might be pregnant. There is a potential for serious side effects to an unborn child. Talk to your health care professional or pharmacist for more information. °You should make sure that you get enough calcium and vitamin D while you are taking this medicine. Discuss the foods you eat and the vitamins you take with your health care professional. °Some people who take this medicine have severe bone, joint, and/or muscle pain. This medicine may also increase your risk for jaw problems or a broken thigh bone. Tell your doctor right away if you have severe pain in your jaw, bones, joints, or muscles. Tell your doctor if you have any pain that does not go away or that gets worse. °Tell your dentist and dental surgeon that you are taking this medicine. You should not have major dental surgery while on this medicine. See your dentist to have a dental exam and fix any dental problems before starting this medicine. Take good care of your teeth while on   this medicine. Make sure you see your dentist for regular follow-up appointments. °What side effects may I notice from receiving this medicine? °Side effects that you should report to your doctor or health care professional as soon as possible: °-allergic reactions  like skin rash, itching or hives, swelling of the face, lips, or tongue °-anxiety, confusion, or depression °-breathing problems °-changes in vision °-eye pain °-feeling faint or lightheaded, falls °-jaw pain, especially after dental work °-mouth sores °-muscle cramps, stiffness, or weakness °-trouble passing urine or change in the amount of urine °Side effects that usually do not require medical attention (report to your doctor or health care professional if they continue or are bothersome): °-bone, joint, or muscle pain °-constipation °-diarrhea °-fever °-hair loss °-irritation at site where injected °-loss of appetite °-nausea, vomiting °-stomach upset °-trouble sleeping °-trouble swallowing °-weak or tired °This list may not describe all possible side effects. Call your doctor for medical advice about side effects. You may report side effects to FDA at 1-800-FDA-1088. °Where should I keep my medicine? °This drug is given in a hospital or clinic and will not be stored at home. °NOTE: This sheet is a summary. It may not cover all possible information. If you have questions about this medicine, talk to your doctor, pharmacist, or health care provider. °© 2014, [employer]. (2012-11-24 10:03:48) ° °

## 2013-11-10 NOTE — Progress Notes (Signed)
Pt states "esophageal" issues  With boniva and actonel by mouth  Call placed to dr Osborne Casco  He states to proceed with reclast  Pt understands and wishes to proceed also

## 2013-12-11 ENCOUNTER — Ambulatory Visit: Payer: Medicare Other | Admitting: Obstetrics & Gynecology

## 2013-12-21 ENCOUNTER — Ambulatory Visit (INDEPENDENT_AMBULATORY_CARE_PROVIDER_SITE_OTHER): Payer: Medicare Other | Admitting: Obstetrics & Gynecology

## 2013-12-21 ENCOUNTER — Encounter: Payer: Self-pay | Admitting: Obstetrics & Gynecology

## 2013-12-21 VITALS — BP 120/66 | HR 100 | Resp 16 | Ht 59.0 in | Wt 148.4 lb

## 2013-12-21 DIAGNOSIS — Z01419 Encounter for gynecological examination (general) (routine) without abnormal findings: Secondary | ICD-10-CM

## 2013-12-21 NOTE — Progress Notes (Signed)
79 y.o. G2P2 MarriedCaucasianF here for annual exam.  Did Reclast in May.  Plan to repeat in one year.  Will repeat BMD 2 years.    Pt sees Dr. Osborne Casco yearly.  Has appt in October.  Has two other appts today and is feeling very rushed.    Had another cousin with breast cancer, 2 years out.  Genetic testing, for cousin, was negative.    Patient's last menstrual period was 06/25/1970.          Sexually active: Yes.    The current method of family planning is status post hysterectomy.    Exercising: Yes.    walking Smoker:  no  Health Maintenance: Pap:  ? 2006 History of abnormal Pap:  no MMG:  03/13/13 3D-normal Colonoscopy:  12/14-no repeat  BMD:   1/15-had reclast infusion on 11/10/13, no side effects TDaP:  Dr Osborne Casco will update Screening Labs: PCP, Hb today: PCP, Urine today: PCP   reports that she has never smoked. She has never used smokeless tobacco. She reports that she does not drink alcohol or use illicit drugs.  Past Medical History  Diagnosis Date  . Hyperlipidemia   . Glaucoma   . Osteopenia   . Post-nasal drainage   . Cough   . Hypertension 11/09  . Compression fracture of spine 01/08    patient denies  . Breast cancer     lumpectomy and radiation (lobular breast ca)  . Asthma in adult     adult onset  . Osteoporosis     patient denies  . Personal history of colonic adenomas 04/10/2013    Past Surgical History  Procedure Laterality Date  . Total abdominal hysterectomy  1972  . Cesarean section  [id number]  . Cholecystectomy  2002  . Tonsillectomy and adenoidectomy  1944  . Breast lumpectomy Left 2004  . Dilation and curettage of uterus  1969, 1970, 1971  . Colonoscopy    . Upper gastrointestinal endoscopy    . Reclast infusion  11/10/13    Current Outpatient Prescriptions  Medication Sig Dispense Refill  . aspirin 81 MG tablet Take 81 mg by mouth daily.      . Calcium Citrate-Vitamin D (CALCIUM CITRATE +D PO) Take by mouth daily.      .  Cholecalciferol (VITAMIN D PO) Take 1,200 Int'l Units by mouth daily.      . CRESTOR 10 MG tablet Take 1 tablet by mouth Daily.      Marland Kitchen latanoprost (XALATAN) 0.005 % ophthalmic solution Daily.      Marland Kitchen losartan-hydrochlorothiazide (HYZAAR) 50-12.5 MG per tablet Take 1 tablet by mouth daily.      . zoledronic acid (RECLAST) 5 MG/100ML SOLN injection Inject 5 mg into the vein once.      . beclomethasone (QVAR) 80 MCG/ACT inhaler Inhale 1 puff into the lungs daily.       No current facility-administered medications for this visit.    Family History  Problem Relation Age of Onset  . Heart attack Mother   . Diabetes Mother   . Hypertension Mother   . Breast cancer Maternal Aunt        . Emphysema Sister   . Ovarian cancer Paternal Aunt        . Lung cancer Father   . Thyroid disease Father     low thyroid dysfunction  . Breast cancer Cousin 18    died 78  . Breast cancer Cousin 19  . Breast cancer Cousin 10  died 2  . Breast cancer Cousin 79  . Colon cancer Paternal Uncle 32    ROS:  Pertinent items are noted in HPI.  Otherwise, a comprehensive ROS was negative.  Exam:   LMP 06/25/1970  BP 120/66  P 100  R 16  Wt 148.4  Ht 39ft 11in      Ht Readings from Last 3 Encounters:  11/10/13 5' (1.524 m)  08/19/13 5' (1.524 m)  08/07/13 4\' 11"  (1.499 m)    General appearance: alert, cooperative and appears stated age Head: Normocephalic, without obvious abnormality, atraumatic Neck: no adenopathy, supple, symmetrical, trachea midline and thyroid normal to inspection and palpation Lungs: clear to auscultation bilaterally Breasts: normal appearance, no masses or tenderness Heart: regular rate and rhythm Abdomen: soft, non-tender; bowel sounds normal; no masses,  no organomegaly Extremities: extremities normal, atraumatic, no cyanosis or edema Skin: Skin color, texture, turgor normal. No rashes or lesions Lymph nodes: Cervical, supraclavicular, and axillary nodes normal. No abnormal  inguinal nodes palpated Neurologic: Grossly normal   Pelvic: External genitalia:  no lesions              Urethra:  normal appearing urethra with no masses, tenderness or lesions              Bartholins and Skenes: normal                 Vagina: normal appearing vagina with normal color and discharge, no lesions              Cervix: absent              Pap taken: No. Bimanual Exam:  Uterus:  uterus absent              Adnexa: no mass, fullness, tenderness               Rectovaginal: Confirms               Anus:  normal sphincter tone, no lesions  A:  A: Well Woman with normal exam  H/O lobar breast cancer 2004 (Stage I invasive)  Vaginal atrophy  Hypertension Elevated lipids  Asthma  Osteopororsis.  On Reclast.  Repeat 1 year.  Will have Dr. Osborne Casco order this.  Then BMD 2017. Glaucoma   P: Mammogram yearly, doing 3D  Labs with Dr. Osborne Casco yearly (10/14 in media tab)  return annually or prn   An After Visit Summary was printed and given to the patient.

## 2014-04-26 ENCOUNTER — Encounter: Payer: Self-pay | Admitting: Obstetrics & Gynecology

## 2014-11-12 ENCOUNTER — Ambulatory Visit (HOSPITAL_COMMUNITY)
Admission: RE | Admit: 2014-11-12 | Discharge: 2014-11-12 | Disposition: A | Discharge status: Home / Self Care | Payer: Medicare Other | Source: Ambulatory Visit | Attending: Internal Medicine | Admitting: Internal Medicine

## 2014-11-12 ENCOUNTER — Emergency Department (HOSPITAL_COMMUNITY)
Admission: EM | Admit: 2014-11-12 | Discharge: 2014-11-12 | Disposition: A | Discharge status: Home / Self Care | Payer: Medicare Other | Attending: Emergency Medicine | Admitting: Emergency Medicine

## 2014-11-12 ENCOUNTER — Encounter (HOSPITAL_COMMUNITY): Payer: Self-pay | Admitting: Emergency Medicine

## 2014-11-12 ENCOUNTER — Other Ambulatory Visit (HOSPITAL_COMMUNITY): Payer: Self-pay | Admitting: Internal Medicine

## 2014-11-12 DIAGNOSIS — H409 Unspecified glaucoma: Secondary | ICD-10-CM | POA: Diagnosis not present

## 2014-11-12 DIAGNOSIS — Z7982 Long term (current) use of aspirin: Secondary | ICD-10-CM | POA: Diagnosis not present

## 2014-11-12 DIAGNOSIS — T50995A Adverse effect of other drugs, medicaments and biological substances, initial encounter: Secondary | ICD-10-CM | POA: Insufficient documentation

## 2014-11-12 DIAGNOSIS — Z86018 Personal history of other benign neoplasm: Secondary | ICD-10-CM | POA: Insufficient documentation

## 2014-11-12 DIAGNOSIS — I10 Essential (primary) hypertension: Secondary | ICD-10-CM | POA: Diagnosis not present

## 2014-11-12 DIAGNOSIS — Z8639 Personal history of other endocrine, nutritional and metabolic disease: Secondary | ICD-10-CM | POA: Insufficient documentation

## 2014-11-12 DIAGNOSIS — R220 Localized swelling, mass and lump, head: Secondary | ICD-10-CM | POA: Diagnosis not present

## 2014-11-12 DIAGNOSIS — Z8739 Personal history of other diseases of the musculoskeletal system and connective tissue: Secondary | ICD-10-CM | POA: Insufficient documentation

## 2014-11-12 DIAGNOSIS — Z5181 Encounter for therapeutic drug level monitoring: Secondary | ICD-10-CM | POA: Insufficient documentation

## 2014-11-12 DIAGNOSIS — J45909 Unspecified asthma, uncomplicated: Secondary | ICD-10-CM | POA: Diagnosis not present

## 2014-11-12 DIAGNOSIS — M810 Age-related osteoporosis without current pathological fracture: Secondary | ICD-10-CM | POA: Insufficient documentation

## 2014-11-12 DIAGNOSIS — T7840XA Allergy, unspecified, initial encounter: Secondary | ICD-10-CM | POA: Diagnosis not present

## 2014-11-12 DIAGNOSIS — Z7951 Long term (current) use of inhaled steroids: Secondary | ICD-10-CM | POA: Diagnosis not present

## 2014-11-12 DIAGNOSIS — Z79899 Other long term (current) drug therapy: Secondary | ICD-10-CM | POA: Diagnosis not present

## 2014-11-12 DIAGNOSIS — Z853 Personal history of malignant neoplasm of breast: Secondary | ICD-10-CM | POA: Insufficient documentation

## 2014-11-12 MED ORDER — DIPHENHYDRAMINE HCL 25 MG PO TABS: 25.0000 mg | ORAL_TABLET | Freq: Four times a day (QID) | ORAL | Status: DC | PRN

## 2014-11-12 MED ORDER — DIPHENHYDRAMINE HCL 50 MG/ML IJ SOLN
25.0000 mg | Freq: Once | INTRAMUSCULAR | Status: AC
  Administered 2014-11-12: 25 mg via INTRAVENOUS
  Filled 2014-11-12: qty 1

## 2014-11-12 MED ORDER — RANITIDINE HCL 150 MG PO TABS: 150.0000 mg | ORAL_TABLET | Freq: Two times a day (BID) | ORAL | Status: DC

## 2014-11-12 MED ORDER — SODIUM CHLORIDE 0.9 % IV SOLN
Freq: Once | INTRAVENOUS | Status: AC
  Administered 2014-11-12: 250 mL via INTRAVENOUS

## 2014-11-12 MED ORDER — ZOLEDRONIC ACID 5 MG/100ML IV SOLN
5.0000 mg | Freq: Once | INTRAVENOUS | Status: AC
  Administered 2014-11-12: 5 mg via INTRAVENOUS
  Filled 2014-11-12: qty 100

## 2014-11-12 MED ORDER — PREDNISONE 20 MG PO TABS: ORAL_TABLET | ORAL | Status: DC

## 2014-11-12 MED ORDER — METHYLPREDNISOLONE SODIUM SUCC 125 MG IJ SOLR
80.0000 mg | Freq: Once | INTRAMUSCULAR | Status: AC
  Administered 2014-11-12: 80 mg via INTRAVENOUS
  Filled 2014-11-12: qty 2

## 2014-11-12 MED ORDER — FAMOTIDINE 20 MG PO TABS
40.0000 mg | ORAL_TABLET | Freq: Once | ORAL | Status: AC
  Administered 2014-11-12: 40 mg via ORAL
  Filled 2014-11-12: qty 2

## 2014-11-12 NOTE — ED Notes (Signed)
Bed: WA09 Expected date:  Expected time:  Means of arrival:  Comments: Short stay pt/med reaction

## 2014-11-12 NOTE — Discharge Instructions (Signed)
RECLAST °Zoledronic Acid injection (Paget's Disease, Osteoporosis) °What is this medicine? °ZOLEDRONIC ACID (ZOE le dron ik AS id) lowers the amount of calcium loss from bone. It is used to treat Paget's disease and osteoporosis in women. °This medicine may be used for other purposes; ask your health care provider or pharmacist if you have questions. °COMMON BRAND NAME(S): Reclast, Zometa °What should I tell my health care provider before I take this medicine? °They need to know if you have any of these conditions: °-aspirin-sensitive asthma °-cancer, especially if you are receiving medicines used to treat cancer °-dental disease or wear dentures °-infection °-kidney disease °-low levels of calcium in the blood °-past surgery on the parathyroid gland or intestines °-receiving corticosteroids like dexamethasone or prednisone °-an unusual or allergic reaction to zoledronic acid, other medicines, foods, dyes, or preservatives °-pregnant or trying to get pregnant °-breast-feeding °How should I use this medicine? °This medicine is for infusion into a vein. It is given by a health care professional in a hospital or clinic setting. °Talk to your pediatrician regarding the use of this medicine in children. This medicine is not approved for use in children. °Overdosage: If you think you have taken too much of this medicine contact a poison control center or emergency room at once. °NOTE: This medicine is only for you. Do not share this medicine with others. °What if I miss a dose? °It is important not to miss your dose. Call your doctor or health care professional if you are unable to keep an appointment. °What may interact with this medicine? °-certain antibiotics given by injection °-NSAIDs, medicines for pain and inflammation, like ibuprofen or naproxen °-some diuretics like bumetanide, furosemide °-teriparatide °This list may not describe all possible interactions. Give your health care provider a list of all the  medicines, herbs, non-prescription drugs, or dietary supplements you use. Also tell them if you smoke, drink alcohol, or use illegal drugs. Some items may interact with your medicine. °What should I watch for while using this medicine? °Visit your doctor or health care professional for regular checkups. It may be some time before you see the benefit from this medicine. Do not stop taking your medicine unless your doctor tells you to. Your doctor may order blood tests or other tests to see how you are doing. °Women should inform their doctor if they wish to become pregnant or think they might be pregnant. There is a potential for serious side effects to an unborn child. Talk to your health care professional or pharmacist for more information. °You should make sure that you get enough calcium and vitamin D while you are taking this medicine. Discuss the foods you eat and the vitamins you take with your health care professional. °Some people who take this medicine have severe bone, joint, and/or muscle pain. This medicine may also increase your risk for jaw problems or a broken thigh bone. Tell your doctor right away if you have severe pain in your jaw, bones, joints, or muscles. Tell your doctor if you have any pain that does not go away or that gets worse. °Tell your dentist and dental surgeon that you are taking this medicine. You should not have major dental surgery while on this medicine. See your dentist to have a dental exam and fix any dental problems before starting this medicine. Take good care of your teeth while on this medicine. Make sure you see your dentist for regular follow-up appointments. °What side effects may I notice from receiving this   medicine? Side effects that you should report to your doctor or health care professional as soon as possible: -allergic reactions like skin rash, itching or hives, swelling of the face, lips, or tongue -anxiety, confusion, or depression -breathing  problems -changes in vision -eye pain -feeling faint or lightheaded, falls -jaw pain, especially after dental work -mouth sores -muscle cramps, stiffness, or weakness -trouble passing urine or change in the amount of urine Side effects that usually do not require medical attention (report to your doctor or health care professional if they continue or are bothersome): -bone, joint, or muscle pain -constipation -diarrhea -fever -hair loss -irritation at site where injected -loss of appetite -nausea, vomiting -stomach upset -trouble sleeping -trouble swallowing -weak or tired This list may not describe all possible side effects. Call your doctor for medical advice about side effects. You may report side effects to FDA at 1-800-FDA-1088. Where should I keep my medicine? This drug is given in a hospital or clinic and will not be stored at home. NOTE: This sheet is a summary. It may not cover all possible information. If you have questions about this medicine, talk to your doctor, pharmacist, or health care provider.  2015, [employer]. (2012-11-24 10:03:48)

## 2014-11-12 NOTE — ED Provider Notes (Signed)
CSN: [id number]     Arrival date & time 11/12/14  1542 History   First MD Initiated Contact with Patient 11/12/14 1604     Chief Complaint  Patient presents with  . Allergic Reaction     (Consider location/radiation/quality/duration/timing/severity/associated sxs/prior Treatment) HPI Comments: Brandy Andrade is a 80 y.o. female with a PMHx of HLD, glaucoma, osteoporosis, HTN, breast cancer s/p lumpectomy and radiation, and asthma, who presents to the ED with complaints of allergic reaction after having a Reclast infusion.  she states  that around 3:10 PM, approximately 10 minutes before finishing her infusion, the nurse asked her "what's wrong with your face" at which time he noticed her left cheek had become erythematous, mildly swollen, and warm to the touch. She was not given anything PTA, and has not worsened since onset. She denies any pain or itching, tongue or lip swelling, drooling, difficulty swallowing or breathing, stridor, wheezing, eye pain or itching, eye discharge, chest pain, shortness breath, abdominal pain, nausea, vomiting, diarrhea, dysuria, hematuria, headache, vision changes, numbness, tingling, or weakness. Denies any new medications or soaps/detergents, and she has had this same infusion one time before with no issues.  Patient is a 80 y.o. female presenting with allergic reaction. The history is provided by the patient. No language interpreter was used.  Allergic Reaction Presenting symptoms: swelling   Presenting symptoms: no difficulty breathing, no difficulty swallowing, no itching and no wheezing   Swelling:    Location:  Face   Onset quality:  Sudden   Duration: 30 minutes.   Timing:  Constant   Progression:  Unchanged   Chronicity:  New Severity:  Mild Prior allergic episodes:  No prior episodes Context: medications   Relieved by:  None tried Worsened by:  Nothing tried Ineffective treatments:  None tried   Past Medical History  Diagnosis Date  .  Hyperlipidemia   . Glaucoma   . Osteopenia   . Post-nasal drainage   . Cough   . Hypertension 11/09  . Compression fracture of spine 01/08    patient denies  . Breast cancer     lumpectomy and radiation (lobular breast ca)  . Asthma in adult     adult onset  . Osteoporosis     patient denies  . Personal history of colonic adenomas 04/10/2013   Past Surgical History  Procedure Laterality Date  . Total abdominal hysterectomy  1972  . Cesarean section  [id number]  . Cholecystectomy  2002  . Tonsillectomy and adenoidectomy  1944  . Breast lumpectomy Left 2004  . Dilation and curettage of uterus  1969, 1970, 1971  . Colonoscopy    . Upper gastrointestinal endoscopy    . Reclast infusion  11/10/13   Family History  Problem Relation Age of Onset  . Heart attack Mother   . Diabetes Mother   . Hypertension Mother   . Breast cancer Maternal Aunt 89       . Emphysema Sister   . Ovarian cancer Paternal Aunt        . Lung cancer Father   . Thyroid disease Father     low thyroid dysfunction  . Breast cancer Cousin 24    died 39  . Breast cancer Cousin 47  . Breast cancer Cousin 69    died 99  . Breast cancer Cousin 43  . Colon cancer Paternal Uncle 61   History  Substance Use Topics  . Smoking status: Never Smoker   . Smokeless tobacco: Never Used  .  Alcohol Use: No   OB History    Gravida Para Term Preterm AB TAB SAB Ectopic Multiple Living   2 2        2      Review of Systems  Constitutional: Negative for fever and chills.  HENT: Positive for facial swelling (L cheek). Negative for drooling, rhinorrhea, sore throat and trouble swallowing.   Eyes: Negative for pain, discharge, itching and visual disturbance.  Respiratory: Negative for shortness of breath, wheezing and stridor.   Cardiovascular: Negative for chest pain.  Gastrointestinal: Negative for nausea, vomiting, abdominal pain, diarrhea and constipation.  Genitourinary: Negative for dysuria and hematuria.    Musculoskeletal: Negative for myalgias, arthralgias and neck pain.  Skin: Positive for color change (mild erythema to L cheek). Negative for itching.  Allergic/Immunologic: Negative for immunocompromised state.  Neurological: Negative for dizziness, weakness, light-headedness, numbness and headaches.  Psychiatric/Behavioral: Negative for confusion.   10 Systems reviewed and are negative for acute change except as noted in the HPI.    Allergies  Bisphosphonates and Codeine  Home Medications   Prior to Admission medications   Medication Sig Start Date End Date Taking? Authorizing Provider  aspirin 81 MG tablet Take 81 mg by mouth daily.   Yes Historical Provider, MD  beclomethasone (QVAR) 80 MCG/ACT inhaler Inhale 1 puff into the lungs daily. 08/15/12  Yes Brand Males, MD  Calcium Citrate-Vitamin D (CALCIUM CITRATE +D PO) Take 1 tablet by mouth 2 (two) times daily.    Yes Historical Provider, MD  CRESTOR 10 MG tablet Take 10 mg by mouth Daily.  08/09/11  Yes Historical Provider, MD  latanoprost (XALATAN) 0.005 % ophthalmic solution Place 1 drop into both eyes at bedtime.  08/24/11  Yes Historical Provider, MD  losartan-hydrochlorothiazide (HYZAAR) 50-12.5 MG per tablet Take 1 tablet by mouth daily.   Yes Historical Provider, MD  zoledronic acid (RECLAST) 5 MG/100ML SOLN injection Inject 5 mg into the vein once.   Yes Historical Provider, MD   BP 155/89 mmHg  Pulse 91  Temp(Src) 97.6 F (36.4 C) (Oral)  Resp 18  Ht 4\' 11"  (1.499 m)  Wt 150 lb (68.04 kg)  BMI 30.28 kg/m2  SpO2 97%  LMP 06/25/1970 Physical Exam  Constitutional: She is oriented to person, place, and time. Vital signs are normal. She appears well-developed and well-nourished.  Non-toxic appearance. No distress.  Afebrile, nontoxic, NAD  HENT:  Head: Normocephalic and atraumatic.    Nose: Nose normal.  Mouth/Throat: Uvula is midline, oropharynx is clear and moist and mucous membranes are normal. No trismus in the  jaw. Normal dentition. No dental abscesses or uvula swelling.  L cheek mildly erythematous and with trace warmth, mildly swollen, nonTTP. Nose clear. Oropharynx clear and moist, without uvular swelling or deviation, no trismus or drooling, no tonsillar swelling or erythema, no exudates, no tongue/lip swelling, patent airway.  Eyes: Conjunctivae and EOM are normal. Right eye exhibits no discharge. Left eye exhibits no discharge.  No periorbital erythema or edema  Neck: Normal range of motion. Neck supple.  No stridor, patent airway, no trachea deviation  Cardiovascular: Normal rate, regular rhythm, normal heart sounds and intact distal pulses.  Exam reveals no gallop and no friction rub.   No murmur heard. Pulmonary/Chest: Effort normal and breath sounds normal. No stridor. No respiratory distress. She has no decreased breath sounds. She has no wheezes. She has no rhonchi. She has no rales.  CTAB in all lung fields, no w/r/r, no hypoxia or increased WOB,  speaking in full sentences, SpO2 97% on RA  Abdominal: Soft. Normal appearance and bowel sounds are normal. She exhibits no distension. There is no tenderness. There is no rigidity, no rebound, no guarding, no CVA tenderness, no tenderness at McBurney's point and negative Murphy's sign.  Musculoskeletal: Normal range of motion.  Neurological: She is alert and oriented to person, place, and time. She has normal strength. No sensory deficit.  Skin: Skin is warm, dry and intact. No rash noted. There is erythema.  Mild erythema and warmth to L cheek as noted above  Psychiatric: She has a normal mood and affect.  Nursing note and vitals reviewed.   ED Course  Procedures (including critical care time) Labs Review Labs Reviewed - No data to display  Imaging Review No results found.   EKG Interpretation None      MDM   Final diagnoses:  Allergic reaction caused by a drug  Left facial swelling    80 y.o. female here with L cheek  swelling, erythema, and trace warmth after getting reclast injection. No tongue/lip swelling, no stridor or wheezing, no difficulty breathing. Patent airway on exam. NonTTP, no dentitia abnormalities. Likely allergic reaction to reclast. Will give solumedrol, benadryl, and pepcid. Will monitor and then reassess shortly.   5:20 PM Pt looking improved, cheek less erythematous and swollen. Feels well. Will discharge home with course of 4 days of prednisone, benadryl, and zantac. Will have her f/up with PCP in 3-5 days for recheck. I explained the diagnosis and have given explicit precautions to return to the ER including for any other new or worsening symptoms. The patient understands and accepts the medical plan as it's been dictated and I have answered their questions. Discharge instructions concerning home care and prescriptions have been given. The patient is STABLE and is discharged to home in good condition.  BP 155/89 mmHg  Pulse 91  Temp(Src) 97.6 F (36.4 C) (Oral)  Resp 18  Ht 4\' 11"  (1.499 m)  Wt 150 lb (68.04 kg)  BMI 30.28 kg/m2  SpO2 97%  LMP 06/25/1970  Meds ordered this encounter  Medications  . diphenhydrAMINE (BENADRYL) injection 25 mg    Sig:   . methylPREDNISolone sodium succinate (SOLU-MEDROL) 125 mg/2 mL injection 80 mg    Sig:   . famotidine (PEPCID) tablet 40 mg    Sig:   . diphenhydrAMINE (BENADRYL) 25 MG tablet    Sig: Take 1 tablet (25 mg total) by mouth every 6 (six) hours as needed (facial swelling). X 5 days    Dispense:  20 tablet    Refill:  0    Order Specific Question:  Supervising Provider    Answer:  MILLER, BRIAN [[id number]]  . ranitidine (ZANTAC) 150 MG tablet    Sig: Take 1 tablet (150 mg total) by mouth 2 (two) times daily. X 5 days    Dispense:  10 tablet    Refill:  0    Order Specific Question:  Supervising Provider    Answer:  MILLER, BRIAN [[id number]]  . predniSONE (DELTASONE) 20 MG tablet    Sig: 3 tabs po daily x 4 days BEGINNING 11/13/14     Dispense:  12 tablet    Refill:  0    Order Specific Question:  Supervising Provider    Answer:  Noemi Chapel [[id number]]     Blanca Carreon Camprubi-Soms, PA-C 11/12/14 1729  Sherwood Gambler, MD 11/13/14 0923

## 2014-11-12 NOTE — Discharge Instructions (Signed)
Take Prednisone, Benadryl, and zantac as prescribed. Continue your usual home medications. Get plenty of rest and drink plenty of fluids. Avoid any known triggers. Please followup with your primary doctor in 3-5 days for discussion of your diagnoses and further evaluation after today's visit. Return to the ER for changes or worsening symptoms. ° ° °Drug Allergy °A drug allergy means you have a strange reaction to a medicine. You may have puffiness (swelling), itching, red rashes, and hives. Some allergic reactions can be life-threatening. °HOME CARE  °If you do not know what caused your reaction: °· Write down medicines you use. °· Write down any problems you have after using medicine. °· Avoid things that cause a reaction. °· You can see an allergy doctor to be tested for allergies. °If you have hives or a rash: °· Take medicine as told by your doctor. °· Place cold cloths on your skin. °· Do not take hot baths or hot showers. Take baths in cool water. °If you are severely allergic: °· Wear a medical bracelet or necklace that lists your allergy. °· Carry your allergy kit or medicine shot to treat severe allergic reactions with you. These can save your life. °· Do not drive until medicine from your shot has worn off, unless your doctor says it is okay. °GET HELP RIGHT AWAY IF:  °· Your mouth is puffy, or you have trouble breathing. °· You have a tight feeling in your chest or throat. °· You have hives, puffiness, or itching all over your body. °· You throw up (vomit) or have watery poop (diarrhea). °· You feel dizzy or pass out (faint). °· You think you are having a reaction. Problems often start within 30 minutes after taking a medicine. °· You are getting worse, not better. °· You have new problems. °· Your problems go away and then come back. °This is an emergency. Use your medicine shot or allergy kit as told. Call your local emergency services (911 in [place] after the shot. Even if you feel better after the  shot, you need to go to the hospital. You may need more medicine to control a severe reaction. °MAKE SURE YOU: °· Understand these instructions. °· Will watch your condition. °· Will get help right away if you are not doing well or get worse. °Document Released: 07/19/2004 Document Revised: 09/03/2011 Document Reviewed: 12/07/2010 °[employer]® Patient Information ©2015 [employer]. This information is not intended to replace advice given to you by your health care provider. Make sure you discuss any questions you have with your health care provider. ° °

## 2014-11-12 NOTE — ED Notes (Signed)
Pt from short stay with swelling, redness, warmth to left side of face post receiving reclast. Pt denies other symptoms.

## 2014-11-12 NOTE — Progress Notes (Signed)
At the end of the 30 minutes RECLAST infusion pt was noted to have left cheek swelling. Denies any shortness of breath or itching. VSS. Placed call to Dr Corena Pilgrim office and received orders to transfer patient to ED for evaluation. Pt is in agreement for this. Called report to Charge Nurse [facility] RN. Pt taken to ED by Filomena Jungling RN via w/c saline well left in right hand and flushed

## 2014-11-17 ENCOUNTER — Encounter (HOSPITAL_COMMUNITY): Payer: Self-pay

## 2014-12-15 DIAGNOSIS — D126 Benign neoplasm of colon, unspecified: Secondary | ICD-10-CM | POA: Insufficient documentation

## 2015-01-07 ENCOUNTER — Encounter: Payer: Self-pay | Admitting: Obstetrics & Gynecology

## 2015-01-07 ENCOUNTER — Ambulatory Visit (INDEPENDENT_AMBULATORY_CARE_PROVIDER_SITE_OTHER): Payer: Medicare Other | Admitting: Obstetrics & Gynecology

## 2015-01-07 VITALS — BP 116/64 | HR 100 | Resp 20 | Ht 59.0 in | Wt 148.0 lb

## 2015-01-07 DIAGNOSIS — Z01419 Encounter for gynecological examination (general) (routine) without abnormal findings: Secondary | ICD-10-CM

## 2015-01-07 NOTE — Progress Notes (Signed)
80 y.o. G2P2 MarriedCaucasianF here for annual exam.  Doing well.  Reports having reaction to Reclast.  Had left sided facial swelling.  Will not have Reclast again.  Next appt with Dr. Osborne Casco is in January.  BMD will be in January.  Denies VB.    Having some increased constipation.  Would like some recommendations.  Patient's last menstrual period was 06/25/1970.          Sexually active: Yes.    The current method of family planning is status post hysterectomy.    Exercising: Yes.    walking Smoker:  no  Health Maintenance: Pap:  ? 2006 History of abnormal Pap:  no MMG:  03/15/14 3D-normal Colonoscopy:  9/14-no repeat [place] BMD:   07/23/13-osteoporosis TDaP:  10/06-getting updated with Dr Osborne Casco at [place]: PCP, Hb today: PCP, Urine today: PCP   reports that she has never smoked. She has never used smokeless tobacco. She reports that she does not drink alcohol or use illicit drugs.  Past Medical History  Diagnosis Date  . Hyperlipidemia   . Glaucoma   . Osteopenia   . Post-nasal drainage   . Cough   . Hypertension 11/09  . Compression fracture of spine 01/08    patient denies  . Breast cancer     lumpectomy and radiation (lobular breast ca)  . Asthma in adult     adult onset  . Osteoporosis     patient denies  . Personal history of colonic adenomas 04/10/2013  . Shingles     left side    Past Surgical History  Procedure Laterality Date  . Total abdominal hysterectomy  1972  . Cesarean section  [id number]  . Cholecystectomy  2002  . Tonsillectomy and adenoidectomy  1944  . Breast lumpectomy Left 2004  . Dilation and curettage of uterus  1969, 1970, 1971  . Colonoscopy    . Upper gastrointestinal endoscopy    . Reclast infusion  11/10/13    Current Outpatient Prescriptions  Medication Sig Dispense Refill  . aspirin 81 MG tablet Take 81 mg by mouth daily.    . beclomethasone (QVAR) 80 MCG/ACT inhaler Inhale 1 puff into the lungs daily.    . Calcium  Citrate-Vitamin D (CALCIUM CITRATE +D PO) Take 1 tablet by mouth 2 (two) times daily.     . ciclopirox (PENLAC) 8 % solution     . CRESTOR 10 MG tablet Take 10 mg by mouth Daily.     . diphenhydrAMINE (BENADRYL) 25 MG tablet Take 1 tablet (25 mg total) by mouth every 6 (six) hours as needed (facial swelling). X 5 days 20 tablet 0  . latanoprost (XALATAN) 0.005 % ophthalmic solution Place 1 drop into both eyes at bedtime.     Marland Kitchen losartan-hydrochlorothiazide (HYZAAR) 50-12.5 MG per tablet Take 1 tablet by mouth daily.    . zoledronic acid (RECLAST) 5 MG/100ML SOLN injection Inject 5 mg into the vein once.     No current facility-administered medications for this visit.    Family History  Problem Relation Age of Onset  . Heart attack Mother   . Diabetes Mother   . Hypertension Mother   . Breast cancer Maternal Aunt 89       . Emphysema Sister   . Ovarian cancer Paternal Aunt        . Lung cancer Father   . Thyroid disease Father     low thyroid dysfunction  . Breast cancer Cousin 77  died 73  . Breast cancer Cousin 57  . Breast cancer Cousin 72    died 102  . Breast cancer Cousin 31  . Colon cancer Paternal Uncle 72  . Osteoporosis Mother   . Osteoporosis Paternal Aunt     x 3    ROS:  Pertinent items are noted in HPI.  Otherwise, a comprehensive ROS was negative.  Exam:   BP 116/64 mmHg  Pulse 100  Resp 20  Ht 4\' 11"  (1.499 m)  Wt 148 lb (67.132 kg)  BMI 29.88 kg/m2  LMP 06/25/1970  Weight change: stable  Height: 4\' 11"  (149.9 cm)  Ht Readings from Last 3 Encounters:  01/07/15 4\' 11"  (1.499 m)  11/12/14 4\' 11"  (1.499 m)  11/12/14 4' 11.75" (1.518 m)   General appearance: alert, cooperative and appears stated age Head: Normocephalic, without obvious abnormality, atraumatic Neck: no adenopathy, supple, symmetrical, trachea midline and thyroid normal to inspection and palpation Lungs: clear to auscultation bilaterally Breasts: normal appearance, no masses or  tenderness Heart: regular rate and rhythm Abdomen: soft, non-tender; bowel sounds normal; no masses,  no organomegaly Extremities: extremities normal, atraumatic, no cyanosis or edema Skin: Skin color, texture, turgor normal. No rashes or lesions Lymph nodes: Cervical, supraclavicular, and axillary nodes normal. No abnormal inguinal nodes palpated Neurologic: Grossly normal   Pelvic: External genitalia:  no lesions              Urethra:  normal appearing urethra with no masses, tenderness or lesions              Bartholins and Skenes: normal                 Vagina: normal appearing vagina with normal color and discharge, no lesions              Cervix: absent              Pap taken: No. Bimanual Exam:  Uterus:  uterus absent              Adnexa: no mass, fullness, tenderness               Rectovaginal: Confirms               Anus:  normal sphincter tone, no lesions  Chaperone was present for exam.  A:  A: Well Woman with normal exam  H/O lobar breast cancer 2004 (Stage I invasive)  Vaginal atrophy  Hypertension Elevated lipids  Asthma  Osteopororsis. On Reclast. Had reaction to Reclast in May.  Will not have another infusion.   Glaucoma   P: Mammogram yearly, doing 3D. BMD due January.  Will do this with Dr. Osborne Casco. Labs with Dr. Osborne Casco yearly (10/14 in media tab)  return annually or prn

## 2015-08-01 ENCOUNTER — Encounter: Payer: Self-pay | Admitting: Internal Medicine

## 2015-09-28 ENCOUNTER — Ambulatory Visit (INDEPENDENT_AMBULATORY_CARE_PROVIDER_SITE_OTHER): Payer: Medicare Other | Admitting: Internal Medicine

## 2015-09-28 ENCOUNTER — Encounter: Payer: Self-pay | Admitting: Internal Medicine

## 2015-09-28 VITALS — BP 120/70 | HR 78 | Ht 59.0 in | Wt 151.0 lb

## 2015-09-28 DIAGNOSIS — R1310 Dysphagia, unspecified: Secondary | ICD-10-CM | POA: Diagnosis not present

## 2015-09-28 NOTE — Patient Instructions (Signed)
  You have been scheduled for a Barium Esophogram at [facility] (1st floor of the hospital) on 10/06/15 at 11:00AM. Please arrive 15 minutes prior to your appointment for registration. Make certain not to have anything to eat or drink 3 hours prior to your test. If you need to reschedule for any reason, please contact radiology at [phone] to do so. __________________________________________________________________ A barium swallow is an examination that concentrates on views of the esophagus. This tends to be a double contrast exam (barium and two liquids which, when combined, create a gas to distend the wall of the oesophagus) or single contrast (non-ionic iodine based). The study is usually tailored to your symptoms so a good history is essential. Attention is paid during the study to the form, structure and configuration of the esophagus, looking for functional disorders (such as aspiration, dysphagia, achalasia, motility and reflux) EXAMINATION You may be asked to change into a gown, depending on the type of swallow being performed. A radiologist and radiographer will perform the procedure. The radiologist will advise you of the type of contrast selected for your procedure and direct you during the exam. You will be asked to stand, sit or lie in several different positions and to hold a small amount of fluid in your mouth before being asked to swallow while the imaging is performed .In some instances you may be asked to swallow barium coated marshmallows to assess the motility of a solid food bolus. The exam can be recorded as a digital or video fluoroscopy procedure. POST PROCEDURE It will take 1-2 days for the barium to pass through your system. To facilitate this, it is important, unless otherwise directed, to increase your fluids for the next 24-48hrs and to resume your normal diet.  This test typically takes about 30 minutes to  perform. __________________________________________________________________________________   I appreciate the opportunity to care for you.

## 2015-09-28 NOTE — Progress Notes (Signed)
   Subjective:    Patient ID: Brandy Andrade, female    DOB: [birth year], 81 y.o.   MRN: [id number] Chief complaint: Dysphagia HPI The patient is a very nice elderly woman I know from prior colonoscopy exams. She has had intermittent trouble with dysphagia for a number of years. When she was on Fosamax and Boniva in the past she seemed to develop this. She modified her diet and is done well. More recently she had to take trimethoprim sulfamethoxazole double strength and had difficulty swallowing that. She reported this to Dr. Osborne Casco who referred her for evaluation. There is a remote EGD in 2004, I don't have access to the full report.  GI review of systems otherwise negative at this time.  Medications, allergies, past medical history, past surgical history, family history and social history are reviewed and updated in the EMR.  Review of Systems As per history of present illness. Some allergy problems she has asthma cough. All other review of systems are negative at this time    Objective:   Physical Exam @BP  120/70 mmHg  Pulse 78  Ht 4\' 11"  (1.499 m)  Wt 151 lb (68.493 kg)  BMI 30.48 kg/m2  LMP 06/25/1970@  General:  Well-developed, well-nourished and in no acute distress Eyes:  anicteric. ENT:   Mouth and posterior pharynx free of lesions. Dentition in good repair Neck:   supple w/o thyromegaly or mass.  Lungs: Clear to auscultation bilaterally. Heart:  S1S2, no rubs, murmurs, gallops. Abdomen:  soft, non-tender, no hepatosplenomegaly, hernia, or mass and BS+.  Lymph:  no cervical or supraclavicular adenopathy. Extremities:   no edema, cyanosis or clubbing Neuro:  A&O x 3.  Psych:  appropriate mood and  Affect.   Data Reviewed: I have reviewed recent primary care notes.       Assessment & Plan:   1. Dysphagia    She could have a stricture she could have esophageal dysmotility or both. Cancer seems very unlikely given the chronicity. She seems well otherwise. We  reviewed the options and a decided to perform a barium swallow with tablet and see where that takes [place]. It could lead to a manometry it could lead to another endoscopy and possible soft with dilation or we could continue to observe.  I appreciate the opportunity to care for this patient.  25 minutes time spent with patient > half in counseling coordination of care  CC: Haywood Pao, MD

## 2015-10-06 ENCOUNTER — Ambulatory Visit (HOSPITAL_COMMUNITY)
Admission: RE | Admit: 2015-10-06 | Discharge: 2015-10-06 | Disposition: A | Discharge status: Home / Self Care | Payer: Medicare Other | Source: Ambulatory Visit | Attending: Internal Medicine | Admitting: Internal Medicine

## 2015-10-06 DIAGNOSIS — K219 Gastro-esophageal reflux disease without esophagitis: Secondary | ICD-10-CM | POA: Insufficient documentation

## 2015-10-06 DIAGNOSIS — R1310 Dysphagia, unspecified: Secondary | ICD-10-CM | POA: Diagnosis not present

## 2015-10-06 DIAGNOSIS — K224 Dyskinesia of esophagus: Secondary | ICD-10-CM | POA: Insufficient documentation

## 2015-10-06 DIAGNOSIS — K449 Diaphragmatic hernia without obstruction or gangrene: Secondary | ICD-10-CM | POA: Insufficient documentation

## 2015-10-10 DIAGNOSIS — K449 Diaphragmatic hernia without obstruction or gangrene: Secondary | ICD-10-CM | POA: Insufficient documentation

## 2015-10-10 NOTE — Progress Notes (Signed)
Quick Note:  This study indicates esophagus not squeezing properly "Esophageal dysmotility"  I do not think EGD needed - unless swallowing problems worsen or fail to improve then would consider - she can let me know She should take smaller tablets if possible and chew well   ______

## 2016-02-07 ENCOUNTER — Encounter: Payer: Self-pay | Admitting: Obstetrics & Gynecology

## 2016-02-07 ENCOUNTER — Ambulatory Visit (INDEPENDENT_AMBULATORY_CARE_PROVIDER_SITE_OTHER): Payer: Medicare Other | Admitting: Obstetrics & Gynecology

## 2016-02-07 VITALS — BP 116/66 | HR 86 | Resp 14 | Ht 59.0 in | Wt 152.0 lb

## 2016-02-07 DIAGNOSIS — E875 Hyperkalemia: Secondary | ICD-10-CM

## 2016-02-07 DIAGNOSIS — I10 Essential (primary) hypertension: Secondary | ICD-10-CM | POA: Diagnosis not present

## 2016-02-07 LAB — POTASSIUM: Potassium: 4 mmol/L (ref 3.5–5.3)

## 2016-02-07 NOTE — Progress Notes (Addendum)
81 y.o. G2P2 MarriedCaucasianF here for annual exam.  Doing well.  Saw Dr. Osborne Casco July, 2017.  Potassium was a little elevated and she is a little worried about it.  Denies vaginal bleeding.     Patient's last menstrual period was 06/25/1970.          Sexually active: Yes.    The current method of family planning is post menopausal status.    Exercising: Yes.    walking Smoker:  no  Health Maintenance: Pap:  ? 2006  History of abnormal Pap:  no MMG:  03/18/2015 BIRADS 1 negative  Colonoscopy:  04/02/2013 polyps, diverticulosis no repeat BMD:   07/29/2015 osteoporosis repeat 2-3 years TDaP:  06/2015 with PCP  Pneumonia vaccine(s):  X 2 Zostavax:  >10 years Hep C testing: not indicated Screening Labs: PCP has copy with her today, Hb today: PCP, Urine today: PCP   reports that she has never smoked. She has never used smokeless tobacco. She reports that she does not drink alcohol or use drugs.  Past Medical History:  Diagnosis Date  . Asthma in adult    adult onset  . Breast cancer ([place])    lumpectomy and radiation (lobular breast ca)  . Compression fracture of spine ([place]) 01/08   patient denies  . Cough   . Glaucoma   . Hyperlipidemia   . Hypertension 11/09  . Osteopenia   . Osteoporosis    patient denies  . Personal history of colonic adenomas 04/10/2013  . Post-nasal drainage   . Shingles    left side    Past Surgical History:  Procedure Laterality Date  . BREAST LUMPECTOMY Left 2004  . CESAREAN SECTION  [id number]  . CHOLECYSTECTOMY  2002  . COLONOSCOPY    . [place]  . reclast infusion  11/10/13  . TONSILLECTOMY AND ADENOIDECTOMY  1944  . TOTAL ABDOMINAL HYSTERECTOMY  1972  . UPPER GASTROINTESTINAL ENDOSCOPY      Current Outpatient Prescriptions  Medication Sig Dispense Refill  . aspirin 81 MG tablet Take 81 mg by mouth daily.    . Calcium Citrate-Vitamin D (CALCIUM CITRATE +D PO) Take 1 tablet by mouth 2 (two) times  daily.     . chlorpheniramine (CHLOR-TRIMETON) 4 MG tablet Take 4 mg by mouth 2 (two) times daily as needed for allergies.    Marland Kitchen CRESTOR 10 MG tablet Take 10 mg by mouth Daily.     Marland Kitchen FLOVENT HFA 220 MCG/ACT inhaler     . latanoprost (XALATAN) 0.005 % ophthalmic solution Place 1 drop into both eyes at bedtime.     Marland Kitchen losartan-hydrochlorothiazide (HYZAAR) 50-12.5 MG per tablet Take 1 tablet by mouth daily.     No current facility-administered medications for this visit.     Family History  Problem Relation Age of Onset  . Heart attack Mother   . Diabetes Mother   . Hypertension Mother   . Breast cancer Maternal Aunt 89       . Emphysema Sister   . Ovarian cancer Paternal Aunt        . Lung cancer Father   . Thyroid disease Father     low thyroid dysfunction  . Breast cancer Cousin 6    died 73  . Breast cancer Cousin 10  . Breast cancer Cousin 37    died 57  . Breast cancer Cousin 11  . Colon cancer Paternal Uncle 68  . Osteoporosis Mother   .  Osteoporosis Paternal Aunt     x 3    ROS:  Pertinent items are noted in HPI.  Otherwise, a comprehensive ROS was negative.  Exam:   Vitals:   02/07/16 0846  BP: 116/66  Pulse: 86  Resp: 14  Weight: 152 lb (68.9 kg)  Height: 4\' 11"  (1.499 m)    General appearance: alert, cooperative and appears stated age Head: Normocephalic, without obvious abnormality, atraumatic Neck: no adenopathy, supple, symmetrical, trachea midline and thyroid normal to inspection and palpation Lungs: clear to auscultation bilaterally Breasts: normal right breast, no nipple discharge, no masses, well healed scars x 2 on left breast, no LAD, no masses Heart: regular rate and rhythm Abdomen: soft, non-tender; bowel sounds normal; no masses,  no organomegaly Extremities: extremities normal, atraumatic, no cyanosis or edema Skin: Skin color, texture, turgor normal. No rashes or lesions Lymph nodes: Cervical, supraclavicular, and axillary nodes normal. No  abnormal inguinal nodes palpated Neurologic: Grossly normal   Pelvic: External genitalia:  no lesions              Urethra:  normal appearing urethra with no masses, tenderness or lesions              Bartholins and Skenes: normal                 Vagina: normal appearing vagina with normal color and discharge, no lesions              Cervix: absent              Pap taken: No. Bimanual Exam:  Uterus:  absent              Adnexa: normal adnexa and no mass, fullness, tenderness               Rectovaginal: Confirms               Anus:  normal sphincter tone, no lesions  Chaperone was present for exam.  A:  A: Well Woman with normal exam  H/O lobar breast cancer 2004 (Stage I invasive)  Vaginal atrophy  Hypertension Elevated lipids  Asthma  Osteopororsis. On Reclast. Had reaction to Reclast in May.  Will not have another infusion.   Glaucoma Elevated potassium on labs with Dr. Osborne Casco in July  P: Mammogram yearly, doing 3D. BMD will be repeated in 2 years Repeat potassium level today Labs with Dr. Osborne Casco yearly.  Pt brought copy for me to have scanned into EPIC. return annually or prn

## 2016-11-14 IMAGING — RF DG ESOPHAGUS
10 of 15 series · 14 of 24 positions shown · non-contrast
Comparison: None.

CLINICAL DATA: Dysphagia with tablets

EXAM:
ESOPHOGRAM / BARIUM SWALLOW / BARIUM TABLET STUDY
TECHNIQUE: Combined double contrast and single contrast examination performed
using effervescent crystals, thick barium liquid, and thin barium
liquid. The patient was observed with fluoroscopy swallowing a 13 mm
barium sulphate tablet.
FLUOROSCOPY TIME:  Radiation Exposure Index (as provided by the
fluoroscopic device):
If the device does not provide the exposure index:
Fluoroscopy Time:  1 minutes 36 seconds
Number of Acquired Images:

[Series 1: run · 5 of 10 slices shown (1 of 10)]
[im 1/10]
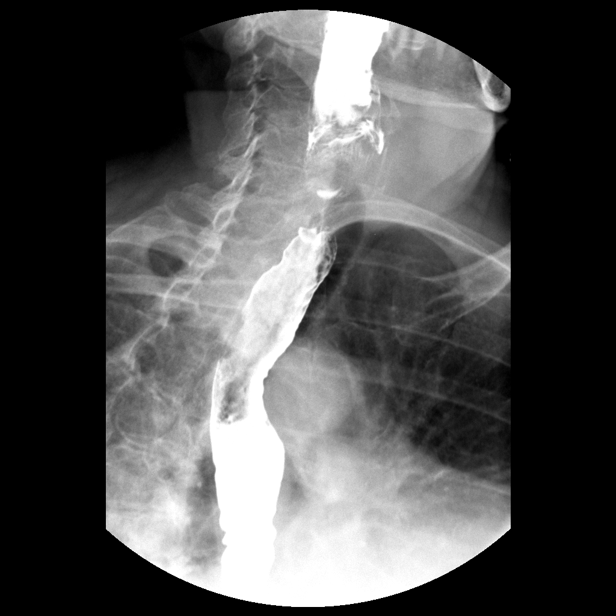
[im 3/10]
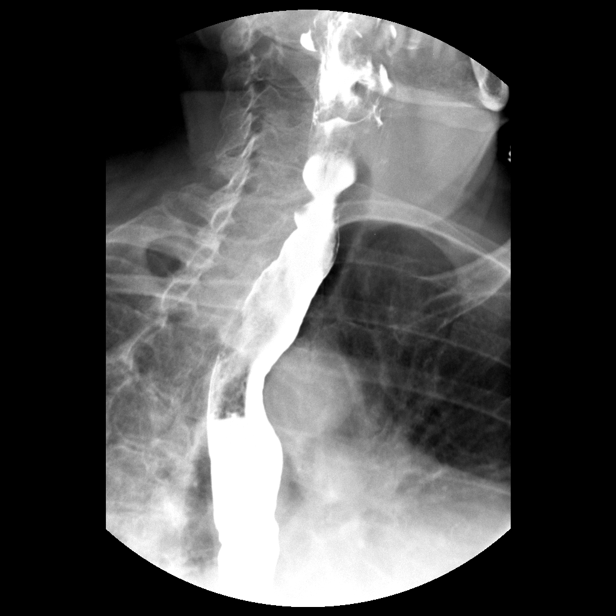
[im 6/10]
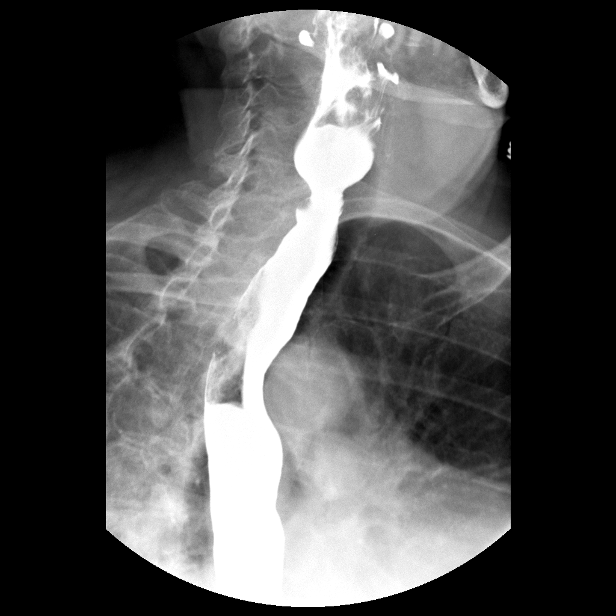
[im 8/10]
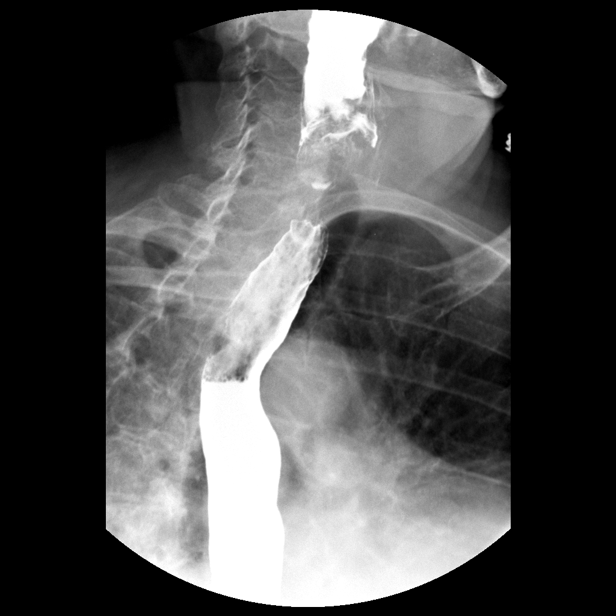
[im 10/10]
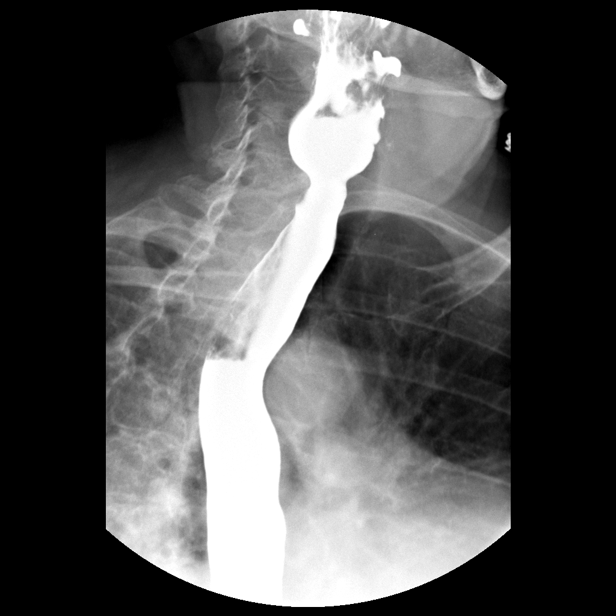

[Series 2: run · 1 of 5 slices shown (2 of 10)]
[im 3/5]
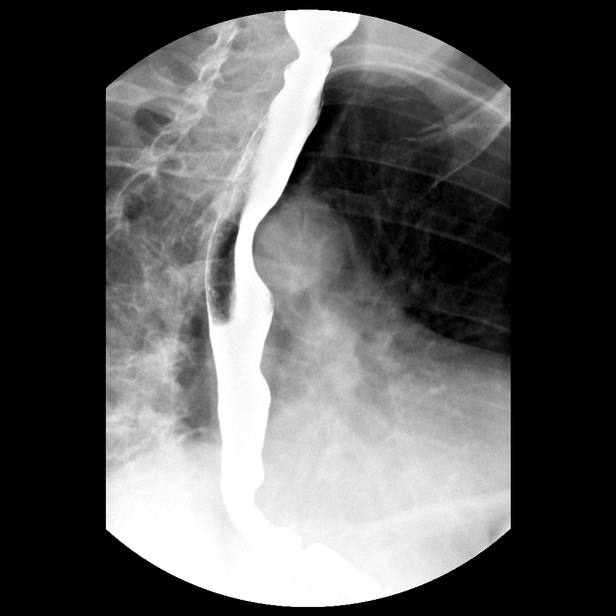

[Series 3: run · 1 of 1 slices shown (3 of 10)]
[im 1/1]
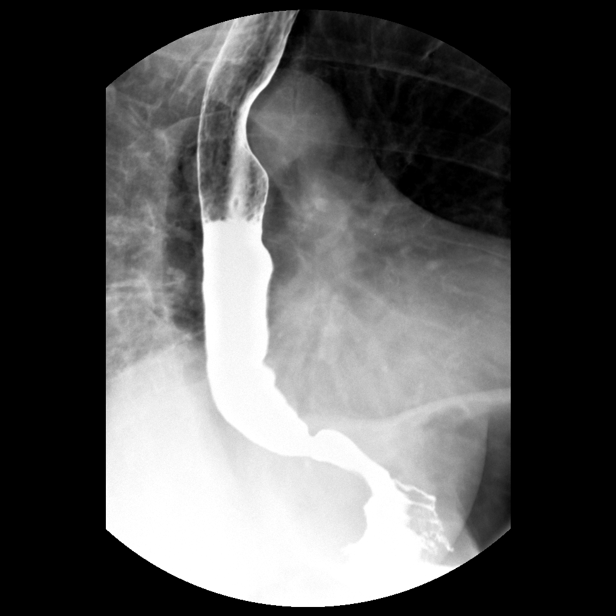

[Series 4: run · 1 of 1 slices shown (4 of 10)]
[im 1/1]
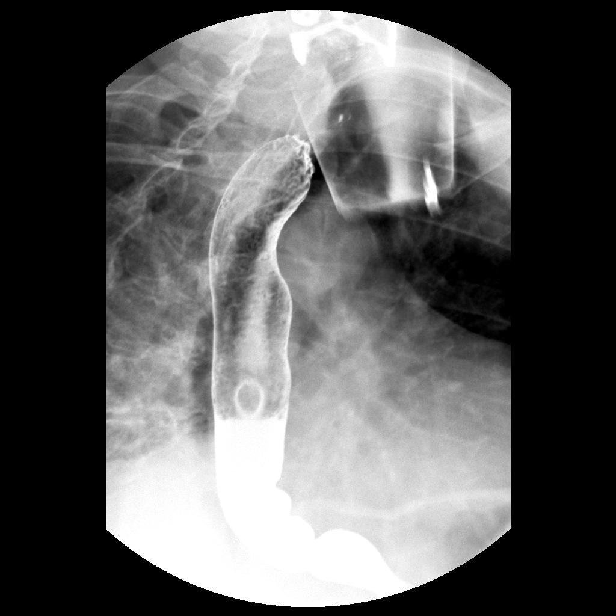

[Series 6: run · 1 of 1 slices shown (5 of 10)]
[im 1/1]
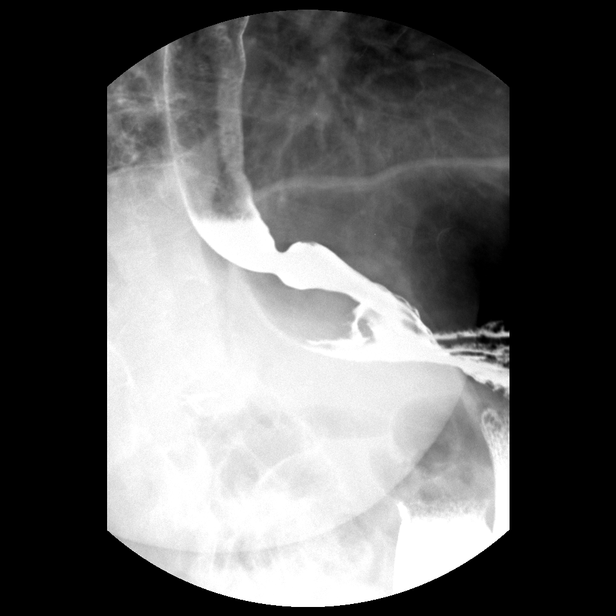

[Series 8: run · 1 of 1 slices shown (6 of 10)]
[im 1/1]
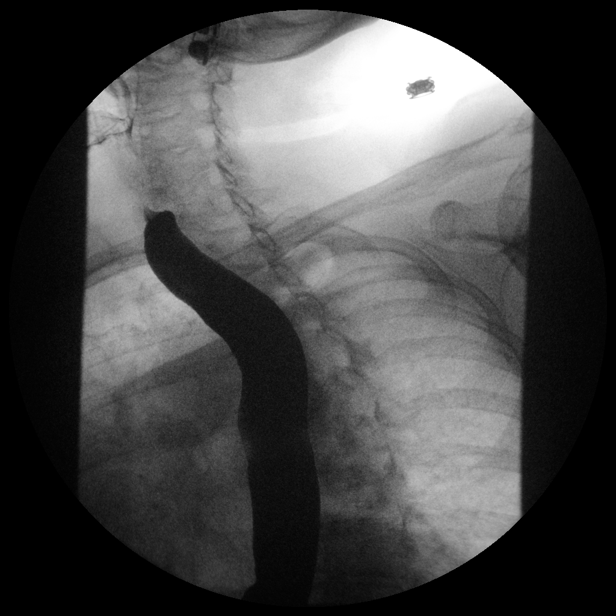

[Series 10: run · 1 of 1 slices shown (7 of 10)]
[im 1/1]
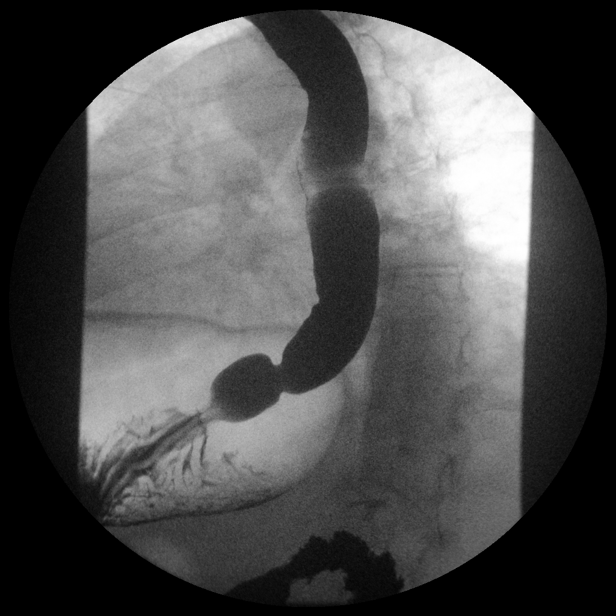

[Series 11: run · 1 of 1 slices shown (8 of 10)]
[im 1/1]
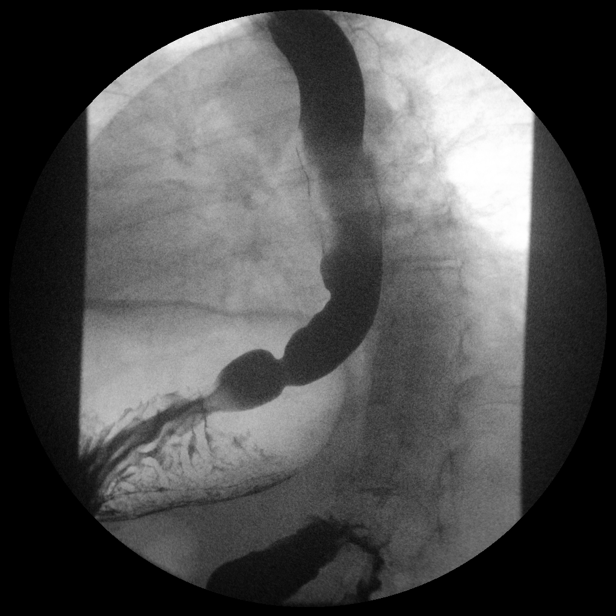

[Series 13: run · 1 of 1 slices shown (9 of 10)]
[im 1/1]
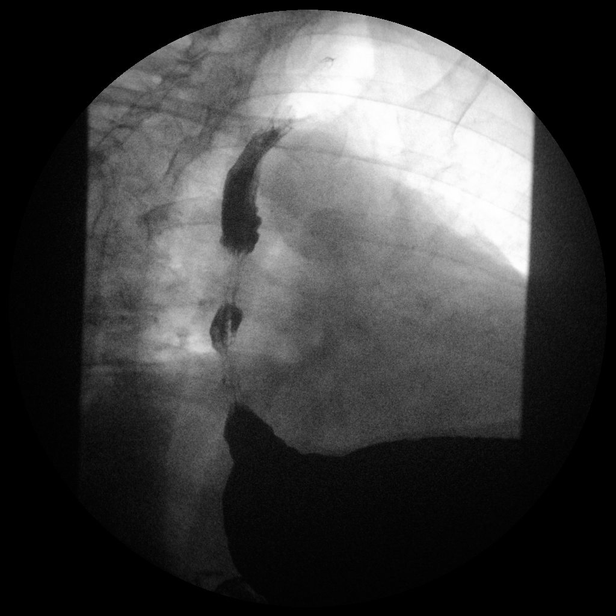

[Series 15: run · 1 of 1 slices shown (10 of 10)]
[im 1/1]
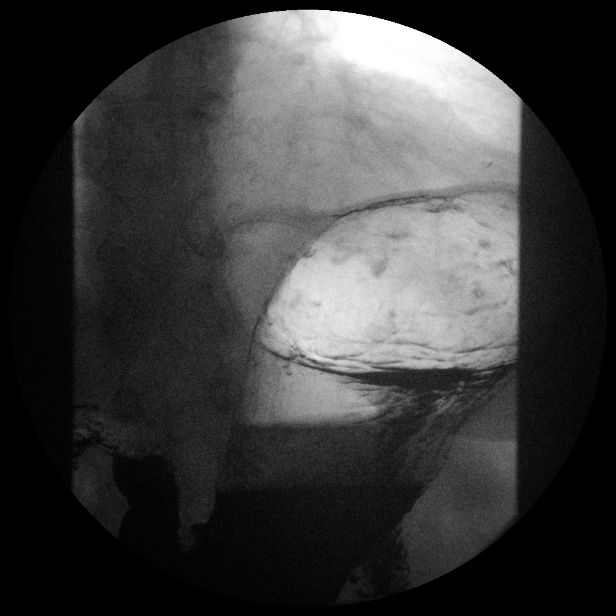

[14 of 24 positions shown; findings below may reference images not displayed]

FINDINGS: Mild esophageal dysmotility.  Esophagus mildly dilated diffusely.

Hiatal hernia with mild narrowing of the GE junction. Moderate
reflux into the upper thoracic esophagus.

Negative for esophageal mass

Barium tablet passed readily into the stomach without significant
delay
IMPRESSION: Hiatal hernia with gastroesophageal reflux.  Esophageal dysmotility.

Mild narrowing at the GE junction. Barium tablet passed readily into
the stomach.

## 2017-05-03 ENCOUNTER — Ambulatory Visit (INDEPENDENT_AMBULATORY_CARE_PROVIDER_SITE_OTHER): Payer: Medicare Other | Admitting: Obstetrics & Gynecology

## 2017-05-03 ENCOUNTER — Encounter: Payer: Self-pay | Admitting: Obstetrics & Gynecology

## 2017-05-03 VITALS — BP 130/70 | HR 92 | Resp 16 | Ht 59.0 in | Wt 145.0 lb

## 2017-05-03 DIAGNOSIS — Z01419 Encounter for gynecological examination (general) (routine) without abnormal findings: Secondary | ICD-10-CM | POA: Diagnosis not present

## 2017-05-03 NOTE — Progress Notes (Signed)
82 y.o. G2P2 MarriedCaucasianF here for annual exam.  Doing well.  Sees Dr. Osborne Casco twice yearly.  Had blood work in July and brought this with her.  All was good.   Denies vaginal bleeding.     Had significant issues with Reclast with facial swelling.  Has not decided to try anything else  She is doing a lot more exercise due to husband's dementia.  Feeling a lot better about her exercise.    Patient's last menstrual period was 06/25/1970.          Sexually active: Yes.    The current method of family planning is status post hysterectomy.    Exercising: Yes.    walking, yard work Smoker:  no  Health Maintenance: Pap:  2006 History of abnormal Pap:  no MMG:  02/2017 Normal Colonoscopy:  04/02/13 Polyps. No f/u needed  BMD:  07/29/15 Osteoporosis TDaP:  2017  Pneumonia vaccine(s):  2002 Zostavax: will get one in december Hep C testing: N/A Screening Labs: PCP   reports that  has never smoked. she has never used smokeless tobacco. She reports that she does not drink alcohol or use drugs.  Past Medical History:  Diagnosis Date  . Asthma in adult    adult onset  . Breast cancer ([place])    lumpectomy and radiation (lobular breast ca)  . Compression fracture of spine ([place]) 01/08   patient denies  . Cough   . Glaucoma   . Hyperlipidemia   . Hypertension 11/09  . Osteopenia   . Personal history of colonic adenomas 04/10/2013  . Post-nasal drainage   . Shingles    left side    Past Surgical History:  Procedure Laterality Date  . BREAST LUMPECTOMY Left 2004  . CESAREAN SECTION  [id number]  . CHOLECYSTECTOMY  2002  . COLONOSCOPY    . [place]  . reclast infusion  11/10/13  . TONSILLECTOMY AND ADENOIDECTOMY  1944  . TOTAL ABDOMINAL HYSTERECTOMY  1972  . UPPER GASTROINTESTINAL ENDOSCOPY      Current Outpatient Medications  Medication Sig Dispense Refill  . aspirin 81 MG tablet Take 81 mg by mouth daily.    . Calcium Citrate-Vitamin D  (CALCIUM CITRATE +D PO) Take 1 tablet by mouth 2 (two) times daily.     . chlorpheniramine (CHLOR-TRIMETON) 4 MG tablet Take 4 mg by mouth 2 (two) times daily as needed for allergies.    . Cholecalciferol (VITAMIN D PO) Take 400 Int'l Units by mouth every evening.    Marland Kitchen CRESTOR 10 MG tablet Take 10 mg by mouth Daily.     Marland Kitchen FLOVENT HFA 220 MCG/ACT inhaler     . latanoprost (XALATAN) 0.005 % ophthalmic solution Place 1 drop into both eyes at bedtime.     Marland Kitchen losartan-hydrochlorothiazide (HYZAAR) 50-12.5 MG per tablet Take 1 tablet by mouth daily.     No current facility-administered medications for this visit.     Family History  Problem Relation Age of Onset  . Colon cancer Paternal Uncle 79  . Heart attack Mother   . Diabetes Mother   . Hypertension Mother   . Osteoporosis Mother   . Breast cancer Maternal Aunt 89          . Emphysema Sister   . Ovarian cancer Paternal Aunt           . Lung cancer Father   . Thyroid disease Father  low thyroid dysfunction  . Breast cancer Cousin 36       died 56  . Breast cancer Cousin 70  . Breast cancer Cousin 60       died 45  . Breast cancer Cousin 20  . Osteoporosis Paternal Aunt        x 3    ROS:  Pertinent items are noted in HPI.  Otherwise, a comprehensive ROS was negative.  Exam:   Wt:  145#  BP:  130/70  R:  16  Ht:  4\' 11"   P: 92 General appearance: alert, cooperative and appears stated age Head: Normocephalic, without obvious abnormality, atraumatic Neck: no adenopathy, supple, symmetrical, trachea midline and thyroid normal to inspection and palpation Lungs: clear to auscultation bilaterally Breasts: normal right breast with no masses, no skin changes, no LAD, no nipple discharge; left breast with well healed incision with firmness s/p radiation, no chagnes Heart: regular rate and rhythm Abdomen: soft, non-tender; bowel sounds normal; no masses,  no organomegaly Extremities: extremities normal, atraumatic, no cyanosis  or edema Skin: Skin color, texture, turgor normal. No rashes or lesions Lymph nodes: Cervical, supraclavicular, and axillary nodes normal. No abnormal inguinal nodes palpated Neurologic: Grossly normal   Pelvic: External genitalia:  no lesions              Urethra:  normal appearing urethra with no masses, tenderness or lesions              Bartholins and Skenes: normal                 Vagina: normal appearing vagina with normal color and discharge, no lesions              Cervix: absent              Pap taken: No. Bimanual Exam:  Uterus:  uterus absent              Adnexa: no mass, fullness, tenderness               Rectovaginal: Confirms               Anus:  normal sphincter tone, no lesions  Chaperone was present for exam.  A:  Well Woman with normal exam H/O lobar breast cancer 2004 (stage I) Vaginal atrophy Hypertension Elevated lipids Asthma Osteoporosis with reaction to Reclast.   Glaucoma  P:   Mammogram guidelines reviewed pap smear not indicated BMD due 2/19.  Will schedule for pt. Lab work done with Dr. Osborne Casco. return annually or prn

## 2017-05-28 ENCOUNTER — Encounter: Payer: Self-pay | Admitting: Obstetrics & Gynecology

## 2017-06-25 HISTORY — PX: CATARACT EXTRACTION, BILATERAL: SHX1313

## 2017-07-29 ENCOUNTER — Encounter: Payer: Self-pay | Admitting: Obstetrics & Gynecology

## 2017-10-16 ENCOUNTER — Encounter (HOSPITAL_COMMUNITY): Payer: Self-pay | Admitting: Emergency Medicine

## 2017-10-16 ENCOUNTER — Other Ambulatory Visit: Payer: Self-pay

## 2017-10-16 ENCOUNTER — Emergency Department (HOSPITAL_COMMUNITY)
Admission: EM | Admit: 2017-10-16 | Discharge: 2017-10-17 | Disposition: A | Discharge status: Home / Self Care | Payer: Medicare Other | Attending: Emergency Medicine | Admitting: Emergency Medicine

## 2017-10-16 DIAGNOSIS — Z7982 Long term (current) use of aspirin: Secondary | ICD-10-CM | POA: Diagnosis not present

## 2017-10-16 DIAGNOSIS — I10 Essential (primary) hypertension: Secondary | ICD-10-CM | POA: Insufficient documentation

## 2017-10-16 DIAGNOSIS — L03211 Cellulitis of face: Secondary | ICD-10-CM | POA: Insufficient documentation

## 2017-10-16 DIAGNOSIS — Z79899 Other long term (current) drug therapy: Secondary | ICD-10-CM | POA: Diagnosis not present

## 2017-10-16 DIAGNOSIS — J45909 Unspecified asthma, uncomplicated: Secondary | ICD-10-CM | POA: Insufficient documentation

## 2017-10-16 DIAGNOSIS — Z9049 Acquired absence of other specified parts of digestive tract: Secondary | ICD-10-CM | POA: Insufficient documentation

## 2017-10-16 DIAGNOSIS — R220 Localized swelling, mass and lump, head: Secondary | ICD-10-CM | POA: Diagnosis present

## 2017-10-16 DIAGNOSIS — Z853 Personal history of malignant neoplasm of breast: Secondary | ICD-10-CM | POA: Diagnosis not present

## 2017-10-16 NOTE — ED Triage Notes (Signed)
Pt states something bit her on her face on Monday evening  Tuesday she just had a red dot  Wednesday morning she noticed it was red and this evening it was about the size of a dime and since it has gotten bigger  Pt states the area is firm to touch  Denies pain

## 2017-10-17 MED ORDER — CEPHALEXIN 500 MG PO CAPS
500.0000 mg | ORAL_CAPSULE | Freq: Once | ORAL | Status: AC
  Administered 2017-10-17: 500 mg via ORAL
  Filled 2017-10-17: qty 1

## 2017-10-17 MED ORDER — CEPHALEXIN 500 MG PO CAPS: 500.0000 mg | ORAL_CAPSULE | Freq: Four times a day (QID) | ORAL | 0 refills | Status: DC

## 2017-10-17 NOTE — ED Provider Notes (Signed)
[facility] DEPT Provider Note   CSN: [id number] Arrival date & time: 10/16/17  2117    History   Chief Complaint Chief Complaint  Patient presents with  . Insect Bite    HPI Brandy Andrade is a 83 y.o. female.  83 year old female with a history of dyslipidemia, hypertension, asthma presents to the emergency department for evaluation of facial swelling.  She states that she was outside in the yard on Monday when she felt like she was bit by something.  She had some redness to the area which was slight and small.  This had increased Tuesday upon waking and became the size of a dime Tuesday night.  She has tried applying topical Benadryl cream without relief.  Patient reports further swelling this evening which prompted her visit to the emergency department.  She has no tenderness or pain to the area and denies any drainage or purulence in the mouth.  No associated fevers.  She is unsure of what bit her.  She is planning to follow-up with her primary care doctor tomorrow.     Past Medical History:  Diagnosis Date  . Asthma in adult    adult onset  . Breast cancer ([place])    lumpectomy and radiation (lobular breast ca)  . Compression fracture of spine ([place]) 01/08   patient denies  . Glaucoma   . Hyperlipidemia   . Hypertension 11/09  . Osteopenia   . Personal history of colonic adenomas 04/10/2013  . Shingles    left side    Patient Active Problem List   Diagnosis Date Noted  . Essential hypertension 02/07/2016  . Osteoporosis, unspecified 08/07/2013  . Personal history of colonic adenomas 04/10/2013  . Asthma in adult 09/22/2012  . Chronic cough 08/22/2011    Past Surgical History:  Procedure Laterality Date  . BREAST LUMPECTOMY Left 2004  . CESAREAN SECTION  [id number]  . CHOLECYSTECTOMY  2002  . [place]  . TONSILLECTOMY AND ADENOIDECTOMY  1944  . TOTAL ABDOMINAL HYSTERECTOMY  1972  .  UPPER GASTROINTESTINAL ENDOSCOPY       OB History    Gravida  2   Para  2   Term      Preterm      AB      Living  2     SAB      TAB      Ectopic      Multiple      Live Births               Home Medications    Prior to Admission medications   Medication Sig Start Date End Date Taking? Authorizing Provider  aspirin 81 MG tablet Take 81 mg by mouth daily.    [provider]  Calcium Citrate-Vitamin D (CALCIUM CITRATE +D PO) Take 1 tablet by mouth 2 (two) times daily.     [provider]  cephALEXin (KEFLEX) 500 MG capsule Take 1 capsule (500 mg total) by mouth 4 (four) times daily. 10/17/17   Antonietta Breach, PA-C  chlorpheniramine (CHLOR-TRIMETON) 4 MG tablet Take 4 mg by mouth 2 (two) times daily as needed for allergies.    [provider]  Cholecalciferol (VITAMIN D PO) Take 400 Int'l Units by mouth every evening.    [provider]  CRESTOR 10 MG tablet Take 10 mg by mouth Daily.  08/09/11   [provider]  [place] HFA 220  MCG/ACT inhaler  08/31/15   [provider]  latanoprost (XALATAN) 0.005 % ophthalmic solution Place 1 drop into both eyes at bedtime.  08/24/11   [provider]  losartan-hydrochlorothiazide (HYZAAR) 50-12.5 MG per tablet Take 1 tablet by mouth daily.    [provider]    Family History Family History  Problem Relation Age of Onset  . Colon cancer Paternal Uncle 42  . Heart attack Mother   . Diabetes Mother   . Hypertension Mother   . Osteoporosis Mother   . Breast cancer Maternal Aunt 89          . Emphysema Sister   . Ovarian cancer Paternal Aunt           . Lung cancer Father   . Thyroid disease Father        low thyroid dysfunction  . Breast cancer Cousin 66       died 69  . Breast cancer Cousin 25  . Breast cancer Cousin 68       died 58  . Breast cancer Cousin 33  . Osteoporosis Paternal Aunt        x 3    Social History Social History   Tobacco  Use  . Smoking status: Never Smoker  . Smokeless tobacco: Never Used  Substance Use Topics  . Alcohol use: No    Alcohol/week: 0.0 oz  . Drug use: No     Allergies   Bisphosphonates; Codeine; and Reclast [zoledronic acid]   Review of Systems Review of Systems Ten systems reviewed and are negative for acute change, except as noted in the HPI.    Physical Exam Updated Vital Signs BP (!) 159/85 (BP Location: Left Arm)   Pulse 97   Temp 98 F (36.7 C) (Oral)   Resp 14   Ht 4\' 11"  (1.499 m)   Wt 67.9 kg (149 lb 9.6 oz)   LMP 06/25/1970   SpO2 94%   BMI 30.22 kg/m   Physical Exam  Constitutional: She is oriented to person, place, and time. She appears well-developed and well-nourished. No distress.  Nontoxic appearing, pleasant  HENT:  Head: Normocephalic and atraumatic.  Nose:    No changes to the buccal mucosa or oropharynx. No trismus.  Eyes: Conjunctivae and EOM are normal. No scleral icterus.  Neck: Normal range of motion.  No meningismus  Pulmonary/Chest: Effort normal. No respiratory distress.  Respirations even and unlabored  Musculoskeletal: Normal range of motion.  Neurological: She is alert and oriented to person, place, and time. She exhibits normal muscle tone. Coordination normal.  Skin: Skin is warm and dry. No rash noted. She is not diaphoretic. No erythema. No pallor.  Psychiatric: She has a normal mood and affect. Her behavior is normal.  Nursing note and vitals reviewed.    ED Treatments / Results  Labs (all labs ordered are listed, but only abnormal results are displayed) Labs Reviewed - No data to display  EKG None  Radiology No results found.  Procedures Procedures (including critical care time)  Medications Ordered in ED Medications  cephALEXin (KEFLEX) capsule 500 mg (has no administration in time range)     Initial Impression / Assessment and Plan / ED Course  I have reviewed the triage vital signs and the nursing  notes.  Pertinent labs & imaging results that were available during my care of the patient were reviewed by me and considered in my medical decision making (see chart for details).  83 year old female presents to the emergency department for mild right sided facial swelling consistent with isolated cellulitic area the size of a dime.  Patient is afebrile, pleasant.  There is no central fluctuance or associated purulence.  No current indication for incision and drainage.  Will start on Keflex for skin flora coverage and continue with outpatient primary care follow-up.  Return precautions discussed and provided. Patient discharged in stable condition with no unaddressed concerns.   Final Clinical Impressions(s) / ED Diagnoses   Final diagnoses:  Cellulitis of face    ED Discharge Orders        Ordered    cephALEXin (KEFLEX) 500 MG capsule  4 times daily     10/17/17 0014       Antonietta Breach, PA-C 10/17/17 0026    Ward, Delice Bison, DO 10/17/17 3734

## 2017-10-17 NOTE — Discharge Instructions (Signed)
Take Keflex as prescribed until finished.  We recommend Tylenol or ibuprofen for any discomfort.  Follow-up with your primary care doctor by the end of the week for recheck of your symptoms.  You may return to the emergency department if symptoms worsen, such as if you develop increasing drainage, fever, or other concerning symptoms.

## 2018-04-09 ENCOUNTER — Encounter: Payer: Self-pay | Admitting: Obstetrics & Gynecology

## 2018-07-24 ENCOUNTER — Other Ambulatory Visit: Payer: Self-pay

## 2018-07-24 ENCOUNTER — Ambulatory Visit (INDEPENDENT_AMBULATORY_CARE_PROVIDER_SITE_OTHER): Payer: Medicare Other | Admitting: Obstetrics & Gynecology

## 2018-07-24 ENCOUNTER — Encounter: Payer: Self-pay | Admitting: Obstetrics & Gynecology

## 2018-07-24 VITALS — BP 122/76 | HR 88 | Resp 16 | Ht 58.75 in | Wt 147.0 lb

## 2018-07-24 DIAGNOSIS — Z01419 Encounter for gynecological examination (general) (routine) without abnormal findings: Secondary | ICD-10-CM

## 2018-07-24 NOTE — Progress Notes (Signed)
84 y.o. G2P2 Married White or Caucasian female here for annual exam.  Denies vaginal bleeding.  Saw Dr. Osborne Casco in January.  Had a spider bite on her face last year.  Ended up with cellulitis.  Was on antibiotics for at least a week.    Husband has "full blown" Alzheimer's now.  He is now in chronic afib.  Has been in the hospital twice in 2019.  These hospital visits were "terrible" due to his confusion.  She is receiving palliative care now.  He is 94 but in good physical shape.  He still does ADLs.  Patient's last menstrual period was 06/25/1970.          Sexually active: No.  The current method of family planning is status post hysterectomy.    Exercising: Yes.    walking, yard work Smoker:  no  Health Maintenance: Pap:  2006 History of abnormal Pap:  no MMG:  03/24/18 BIRADS2:Benign  Colonoscopy:  04/02/13 polyps.  BMD:   07/29/15 Osteoporosis. Had one in 07/2017 [facility] faxing report.  TDaP:  2017 Pneumonia vaccine(s):  2002 Shingrix:   Completed at [facility] Hep C testing: n/a Screening Labs: PCP   reports that she has never smoked. She has never used smokeless tobacco. She reports that she does not drink alcohol or use drugs.  Past Medical History:  Diagnosis Date  . Asthma in adult    adult onset  . Breast cancer ([place])    lumpectomy and radiation (lobular breast ca)  . Compression fracture of spine ([place]) 01/08   patient denies  . Glaucoma   . Hyperlipidemia   . Hypertension 11/09  . Osteopenia   . Personal history of colonic adenomas 04/10/2013  . Shingles    left side    Past Surgical History:  Procedure Laterality Date  . BREAST LUMPECTOMY Left 2004  . CATARACT EXTRACTION, BILATERAL  2019  . CESAREAN SECTION  [id number]  . CHOLECYSTECTOMY  2002  . [place]  . TONSILLECTOMY AND ADENOIDECTOMY  1944  . TOTAL ABDOMINAL HYSTERECTOMY  1972  . UPPER GASTROINTESTINAL ENDOSCOPY      Current Outpatient Medications  Medication Sig  Dispense Refill  . aspirin 81 MG tablet Take 81 mg by mouth daily.    . Calcium Citrate-Vitamin D (CALCIUM CITRATE +D PO) Take 1 tablet by mouth 2 (two) times daily.     . chlorpheniramine (CHLOR-TRIMETON) 4 MG tablet Take 4 mg by mouth 2 (two) times daily as needed for allergies.    . Cholecalciferol (VITAMIN D PO) Take 400 Int'l Units by mouth every evening.    Marland Kitchen CRESTOR 10 MG tablet Take 10 mg by mouth Daily.     Marland Kitchen FLOVENT HFA 220 MCG/ACT inhaler     . latanoprost (XALATAN) 0.005 % ophthalmic solution Place 1 drop into both eyes at bedtime.     Marland Kitchen losartan-hydrochlorothiazide (HYZAAR) 50-12.5 MG per tablet Take 1 tablet by mouth daily.     No current facility-administered medications for this visit.     Family History  Problem Relation Age of Onset  . Colon cancer Paternal Uncle 26  . Heart attack Mother   . Diabetes Mother   . Hypertension Mother   . Osteoporosis Mother   . Breast cancer Maternal Aunt 89          . Emphysema Sister   . Ovarian cancer Paternal Aunt           . Lung cancer  Father   . Thyroid disease Father        low thyroid dysfunction  . Breast cancer Cousin 100       died 25  . Breast cancer Cousin 53  . Breast cancer Cousin 47       died 67  . Breast cancer Cousin 59  . Osteoporosis Paternal Aunt        x 3    Review of Systems  HENT: Positive for hearing loss.   All other systems reviewed and are negative.   Exam:   BP 122/76 (BP Location: Left Arm, Patient Position: Sitting, Cuff Size: Normal)   Pulse 88   Resp 16   Ht 4' 10.75" (1.492 m)   Wt 147 lb (66.7 kg)   LMP 06/25/1970   BMI 29.94 kg/m    Height: 4' 10.75" (149.2 cm)  Ht Readings from Last 3 Encounters:  07/24/18 4' 10.75" (1.492 m)  10/16/17 4\' 11"  (1.499 m)  05/03/17 4\' 11"  (1.499 m)    General appearance: alert, cooperative and appears stated age Head: Normocephalic, without obvious abnormality, atraumatic Neck: no adenopathy, supple, symmetrical, trachea midline and  thyroid normal to inspection and palpation Lungs: clear to auscultation bilaterally Breasts: normal appearance, no masses or tenderness Heart: regular rate and rhythm Abdomen: soft, non-tender; bowel sounds normal; no masses,  no organomegaly Extremities: extremities normal, atraumatic, no cyanosis or edema Skin: Skin color, texture, turgor normal. No rashes or lesions Lymph nodes: Cervical, supraclavicular, and axillary nodes normal. No abnormal inguinal nodes palpated Neurologic: Grossly normal   Pelvic: External genitalia:  no lesions              Urethra:  normal appearing urethra with no masses, tenderness or lesions              Bartholins and Skenes: normal                 Vagina: normal appearing vagina with normal color and discharge, no lesions              Cervix: absent              Pap taken: No. Bimanual Exam:  Uterus:  uterus absent              Adnexa: no mass, fullness, tenderness               Rectovaginal: Confirms               Anus:  normal sphincter tone, no lesions  Chaperone was present for exam.  A:  Well Woman with normal exam H/o lobar breast cancer 2004 (stage 1) Vaginal atrophy Hypertension Elevated lipids Asthma Osteoporosis with reaction to Reclast Glaucoma  P:   Mammogram guidelines reviewed.  Doing yearly. pap smear not indicated BMD was done 2/19.  I did not get results and [facility] was called today. Lab work and vaccines are UTD return annually or prn

## 2018-07-25 ENCOUNTER — Telehealth: Payer: Self-pay | Admitting: Obstetrics & Gynecology

## 2018-07-25 NOTE — Telephone Encounter (Signed)
Call placed to [facility], requested BMD report from 07/2017 be faxed to [facility].   Call returned to patient, advised awaiting BMD report from [facility], once report received and reviewed by Dr. Sabra Heck, our office will return call. Patient thankful for return call and is agreeable.

## 2018-07-25 NOTE — Telephone Encounter (Signed)
Patient is calling regarding a return call that she was expecting from Dr. Sabra Heck yesterday afternoon regarding mammogram. Patient stated that she had her mammogram done on February 4 of last year.

## 2018-07-25 NOTE — Telephone Encounter (Signed)
07/29/2017 BMD report received, placed on Dr. Ammie Ferrier desk for review.  Routing to Dr. Sabra Heck

## 2018-07-28 NOTE — Telephone Encounter (Signed)
Patient notified of results per Dr. Sabra Heck. Patient verbalizes understanding and is agreeable.   [facility] BMD results to scan.   Encounter closed.

## 2018-07-28 NOTE — Telephone Encounter (Signed)
Left message to call Sharee Pimple, RN at [facility].    See [facility] BMD results

## 2019-01-06 ENCOUNTER — Encounter: Payer: Self-pay | Admitting: Obstetrics & Gynecology

## 2019-03-31 ENCOUNTER — Encounter: Payer: Self-pay | Admitting: Obstetrics & Gynecology

## 2019-07-22 ENCOUNTER — Ambulatory Visit: Payer: Medicare Other

## 2019-07-30 ENCOUNTER — Ambulatory Visit: Payer: Medicare Other | Attending: Internal Medicine

## 2019-07-30 DIAGNOSIS — Z23 Encounter for immunization: Secondary | ICD-10-CM | POA: Insufficient documentation

## 2019-07-30 NOTE — Progress Notes (Signed)
   Covid-19 Vaccination Clinic  Name:  Brandy Andrade    MRN: [id number] DOB: [birth year]  07/30/2019  [name] was observed post Covid-19 immunization for 15 minutes without incidence. She was provided with Vaccine Information Sheet and instruction to access the V-Safe system.   [name] was instructed to call 911 with any severe reactions post vaccine: Marland Kitchen Difficulty breathing  . Swelling of your face and throat  . A fast heartbeat  . A bad rash all over your body  . Dizziness and weakness    Immunizations Administered    Name Date Dose VIS Date Route   Pfizer COVID-19 Vaccine 07/30/2019 11:45 AM 0.3 mL 06/05/2019 Intramuscular   Manufacturer: [place]   Lot: [id number]   [place]: [id number]

## 2019-08-24 ENCOUNTER — Ambulatory Visit: Payer: Medicare Other | Attending: Internal Medicine

## 2019-08-24 DIAGNOSIS — Z23 Encounter for immunization: Secondary | ICD-10-CM | POA: Insufficient documentation

## 2019-08-24 NOTE — Progress Notes (Signed)
   Covid-19 Vaccination Clinic  Name:  Brandy Andrade    MRN: [id number] DOB: 1934-07-30  08/24/2019  Brandy Andrade was observed post Covid-19 immunization for 15 minutes without incidence. She was provided with Vaccine Information Sheet and instruction to access the V-Safe system.   Brandy Andrade was instructed to call 911 with any severe reactions post vaccine: Marland Kitchen Difficulty breathing  . Swelling of your face and throat  . A fast heartbeat  . A bad rash all over your body  . Dizziness and weakness    Immunizations Administered    Name Date Dose VIS Date Route   Pfizer COVID-19 Vaccine 08/24/2019  2:17 PM 0.3 mL 06/05/2019 Intramuscular   Manufacturer: [place]   Lot: [id number]   [place]: [id number]

## 2019-11-26 ENCOUNTER — Other Ambulatory Visit: Payer: Self-pay

## 2019-11-26 NOTE — Progress Notes (Signed)
85 y.o. G2P2 Married White or Caucasian female here for annual exam.  Doing well.  Denies vaginal bleeding.     Had Covid diagnosed 06/14/2020.  Had symptoms of worsening asthma.  Rapid test was positive.  PCR was positive.    Decided after husband died, she decided she just couldn't stay in [place].  Has been very physically fit.  Has moved to [place] where her two daughters live.    PCP:  Dr. Osborne Casco  Patient's last menstrual period was 06/25/1970.          Sexually active: Yes.    The current method of family planning is status post hysterectomy.    Exercising: Yes.    bike Smoker:  no  Health Maintenance: Pap:  2006 History of abnormal Pap:  no MMG:  03-31-2019 category b density birads 2:neg Colonoscopy:  04-02-13 polyps BMD:   07-29-2017 osteoporosis (stable) f/u 46yrs.  Has this scheduled in July with Dr. Osborne Casco.   TDaP:  2017 Pneumonia vaccine(s):  2002 Shingrix:   Had done Hep C testing: n/a Screening Labs: Dr. Osborne Casco   reports that she has never smoked. She has never used smokeless tobacco. She reports that she does not drink alcohol or use drugs.  Past Medical History:  Diagnosis Date  . Asthma in adult   . Breast cancer ([place])    lumpectomy and radiation (lobular breast ca)  . Compression fracture of spine ([place]) 06/2006  . Glaucoma   . Hyperlipidemia   . Hypertension 11/09  . Osteopenia   . Personal history of colonic adenomas 04/10/2013  . Shingles    left side    Past Surgical History:  Procedure Laterality Date  . BREAST LUMPECTOMY Left 2004  . CATARACT EXTRACTION, BILATERAL  2019  . CESAREAN SECTION  [id number]  . CHOLECYSTECTOMY  2002  . [place]  . TONSILLECTOMY AND ADENOIDECTOMY  1944  . TOTAL ABDOMINAL HYSTERECTOMY  1972  . UPPER GASTROINTESTINAL ENDOSCOPY      Current Outpatient Medications  Medication Sig Dispense Refill  . aspirin 81 MG tablet Take 81 mg by mouth daily.    . Calcium  Citrate-Vitamin D (CALCIUM CITRATE +D PO) Take 1 tablet by mouth 2 (two) times daily.     . chlorpheniramine (CHLOR-TRIMETON) 4 MG tablet Take 4 mg by mouth 2 (two) times daily as needed for allergies.    . Cholecalciferol (VITAMIN D PO) Take 400 Int'l Units by mouth every evening.    Marland Kitchen CRESTOR 10 MG tablet Take 10 mg by mouth Daily.     Marland Kitchen FLOVENT HFA 220 MCG/ACT inhaler     . latanoprost (XALATAN) 0.005 % ophthalmic solution Place 1 drop into both eyes at bedtime.     Marland Kitchen losartan-hydrochlorothiazide (HYZAAR) 50-12.5 MG per tablet Take 1 tablet by mouth daily.     No current facility-administered medications for this visit.    Family History  Problem Relation Age of Onset  . Colon cancer Paternal Uncle 88  . Heart attack Mother   . Diabetes Mother   . Hypertension Mother   . Osteoporosis Mother   . Breast cancer Maternal Aunt 89          . Emphysema Sister   . Ovarian cancer Paternal Aunt           . Lung cancer Father   . Thyroid disease Father        low thyroid dysfunction  . Breast cancer Cousin 4  died 12  . Breast cancer Cousin 44  . Breast cancer Cousin 75       died 31  . Breast cancer Cousin 73  . Osteoporosis Paternal Aunt        x 3    Review of Systems  All other systems reviewed and are negative.   Exam:   BP 110/60 (BP Location: Right Arm, Patient Position: Sitting, Cuff Size: Normal)   Pulse 68   Temp (!) 97.3 F (36.3 C) (Temporal)   Ht 4' 10.75" (1.492 m)   Wt 130 lb (59 kg)   LMP 06/25/1970   BMI 26.48 kg/m   Height: 4' 10.75" (149.2 cm)  General appearance: alert, cooperative and appears stated age Head: Normocephalic, without obvious abnormality, atraumatic Neck: no adenopathy, supple, symmetrical, trachea midline and thyroid normal to inspection and palpation Lungs: clear to auscultation bilaterally Breasts: right breast normal appearance, no masses or tenderness, left breast with thickening along scar from radiation changes, no masses,  stable Heart: regular rate and rhythm Abdomen: soft, non-tender; bowel sounds normal; no masses,  no organomegaly Extremities: extremities normal, atraumatic, no cyanosis or edema Skin: Skin color, texture, turgor normal. No rashes or lesions Lymph nodes: Cervical, supraclavicular, and axillary nodes normal. No abnormal inguinal nodes palpated Neurologic: Grossly normal   Pelvic: External genitalia:  no lesions              Urethra:  normal appearing urethra with no masses, tenderness or lesions              Bartholins and Skenes: normal                 Vagina: normal appearing vagina with normal color and discharge, no lesions              Cervix: absent              Pap taken: No. Bimanual Exam:  Uterus:  uterus absent              Adnexa: no mass, fullness, tenderness               Rectovaginal: Confirms               Anus:  normal sphincter tone, no lesions  Chaperone, Terence Lux, CMA, was present for exam.  A:  Well Woman with normal exam PMP, no HRT H/o lobar breast cancer 2004 (stage 1) Vaginal atrophic changes* Elevated lipids Osteoporosis with reaction to Reclast Glaucoma Asthma Stable radiation changes on left breast  P:   Mammogram guidelines reviewed.  Doing yearly. pap smear no indicated BMD is scheduled with Dr Osborne Casco in July Coloscopy done 2014 Lab work is scheduled in July with Dr. Osborne Casco Return annually or prn

## 2019-11-30 ENCOUNTER — Encounter: Payer: Self-pay | Admitting: Obstetrics & Gynecology

## 2019-11-30 ENCOUNTER — Other Ambulatory Visit: Payer: Self-pay

## 2019-11-30 ENCOUNTER — Ambulatory Visit (INDEPENDENT_AMBULATORY_CARE_PROVIDER_SITE_OTHER): Payer: Medicare Other | Admitting: Obstetrics & Gynecology

## 2019-11-30 VITALS — BP 110/60 | HR 68 | Temp 97.3°F | Ht 58.75 in | Wt 130.0 lb

## 2019-11-30 DIAGNOSIS — Z01419 Encounter for gynecological examination (general) (routine) without abnormal findings: Secondary | ICD-10-CM

## 2019-12-01 ENCOUNTER — Ambulatory Visit: Payer: Medicare Other | Admitting: Obstetrics & Gynecology

## 2020-12-01 ENCOUNTER — Other Ambulatory Visit: Payer: Self-pay

## 2020-12-01 ENCOUNTER — Ambulatory Visit (INDEPENDENT_AMBULATORY_CARE_PROVIDER_SITE_OTHER): Payer: Medicare Other | Admitting: Obstetrics & Gynecology

## 2020-12-01 ENCOUNTER — Encounter (HOSPITAL_BASED_OUTPATIENT_CLINIC_OR_DEPARTMENT_OTHER): Payer: Self-pay | Admitting: Obstetrics & Gynecology

## 2020-12-01 VITALS — BP 144/82 | HR 107 | Wt 140.0 lb

## 2020-12-01 DIAGNOSIS — N6320 Unspecified lump in the left breast, unspecified quadrant: Secondary | ICD-10-CM | POA: Diagnosis not present

## 2020-12-01 DIAGNOSIS — Z853 Personal history of malignant neoplasm of breast: Secondary | ICD-10-CM | POA: Diagnosis not present

## 2020-12-01 NOTE — Progress Notes (Signed)
GYNECOLOGY  VISIT  CC:   left breast fullness  HPI: 86 y.o. G2P2 Married White or Caucasian female here for complaint of left breast fullness that seems to have gradually worsened over the past few months.  She has moved to [place] with her daughter and reports she has experienced a little weight gain.  The move has been good.  Review from last year shows weight gain has been about 10 pounds.  Pt's daughter works for [employer] to [employer] in [place]  and was helping her mother with a prosthetic bra when daughter also noted this fullness and encouraged her mother to be seen for this issue.  Denies pain or nipple discharge.  Last MMG was 04/05/2020 at [facility].  Pt has already planned to proceed with MMG at [facility] facility in [place] and has appt for October.  Pt has hx of breast cancer with lumpectomy and radiation diagnosed in 2004.  GYNECOLOGIC HISTORY: Patient's last menstrual period was 06/25/1970.  Patient Active Problem List   Diagnosis Date Noted   Essential hypertension 02/07/2016   Osteoporosis, unspecified 08/07/2013   Personal history of colonic adenomas 04/10/2013   Asthma in adult 09/22/2012   Chronic cough 08/22/2011    Past Medical History:  Diagnosis Date   Asthma in adult    Breast cancer [facility])    lumpectomy and radiation (lobular breast ca)   Compression fracture of spine ([place]) 06/2006   Glaucoma    Hyperlipidemia    Hypertension 11/09   Osteopenia    Personal history of colonic adenomas 04/10/2013   Shingles    left side    Past Surgical History:  Procedure Laterality Date   BREAST LUMPECTOMY Left 2004   CATARACT EXTRACTION, BILATERAL  2019   CESAREAN SECTION  6834,1962   CHOLECYSTECTOMY  2002   DILATION AND CURETTAGE OF UTERUS  1969, 1970, [place]   TOTAL ABDOMINAL HYSTERECTOMY  1972   UPPER GASTROINTESTINAL ENDOSCOPY      MEDS:   Current Outpatient Medications on File Prior to Visit  Medication Sig Dispense Refill    aspirin 81 MG tablet Take 81 mg by mouth daily.     Calcium Citrate-Vitamin D (CALCIUM CITRATE +D PO) Take 1 tablet by mouth 2 (two) times daily.      chlorpheniramine (CHLOR-TRIMETON) 4 MG tablet Take 4 mg by mouth 2 (two) times daily as needed for allergies.     Cholecalciferol (VITAMIN D PO) Take 400 Int'l Units by mouth every evening.     CRESTOR 10 MG tablet Take 10 mg by mouth Daily.      FLOVENT HFA 220 MCG/ACT inhaler      latanoprost (XALATAN) 0.005 % ophthalmic solution Place 1 drop into both eyes at bedtime.      losartan-hydrochlorothiazide (HYZAAR) 50-12.5 MG per tablet Take 1 tablet by mouth daily.     No current facility-administered medications on file prior to visit.    ALLERGIES: Bisphosphonates, Codeine, and Reclast [zoledronic acid]  Family History  Problem Relation Age of Onset   Colon cancer Paternal Uncle 72   Heart attack Mother    Diabetes Mother    Hypertension Mother    Osteoporosis Mother    Breast cancer Maternal Aunt 56           Emphysema Sister    Ovarian cancer Paternal Aunt            Lung cancer Father    Thyroid disease Father  low thyroid dysfunction   Breast cancer Cousin 76       died 19   Breast cancer Cousin 87   Breast cancer Cousin 2       died 59   Breast cancer Cousin 72   Osteoporosis Paternal Aunt        x 3    SH:  non smoker  Review of Systems  Constitutional: Negative.   Genitourinary: Negative.    PHYSICAL EXAMINATION:    BP (!) 144/82   Pulse (!) 107   Wt 140 lb (63.5 kg)   LMP 06/25/1970   BMI 28.52 kg/m     Physical Exam Constitutional:      Appearance: Normal appearance.  Neck:     Thyroid: No thyroid mass or thyroid tenderness.  Chest:  Breasts:    Right: No axillary adenopathy or supraclavicular adenopathy.     Left: No axillary adenopathy or supraclavicular adenopathy.    Lymphadenopathy:     Upper Body:     Right upper body: No supraclavicular, axillary or pectoral adenopathy.      Left upper body: No supraclavicular, axillary or pectoral adenopathy.  Neurological:     Mental Status: She is alert.    Assessment/Plan: 1. Left breast mass - Imaging will be scheduled at location closer to patient's home and she will be called with appt. - MM DIAG BREAST TOMO UNI LEFT; Future - US BREAST LTD UNI LEFT INC AXILLA; Future  2. History of breast cancer

## 2020-12-02 DIAGNOSIS — Z853 Personal history of malignant neoplasm of breast: Secondary | ICD-10-CM | POA: Insufficient documentation

## 2021-11-28 ENCOUNTER — Ambulatory Visit: Payer: Medicare Other

## 2021-12-04 ENCOUNTER — Encounter (HOSPITAL_BASED_OUTPATIENT_CLINIC_OR_DEPARTMENT_OTHER): Payer: Self-pay | Admitting: Obstetrics & Gynecology

## 2021-12-04 ENCOUNTER — Ambulatory Visit (INDEPENDENT_AMBULATORY_CARE_PROVIDER_SITE_OTHER): Payer: Medicare Other | Admitting: Obstetrics & Gynecology

## 2021-12-04 ENCOUNTER — Other Ambulatory Visit (HOSPITAL_COMMUNITY)
Admission: RE | Admit: 2021-12-04 | Discharge: 2021-12-04 | Disposition: A | Discharge status: Home / Self Care | Payer: Medicare Other | Source: Ambulatory Visit | Attending: Obstetrics & Gynecology | Admitting: Obstetrics & Gynecology

## 2021-12-04 VITALS — BP 161/80 | HR 78 | Ht 59.0 in | Wt 136.0 lb

## 2021-12-04 DIAGNOSIS — M810 Age-related osteoporosis without current pathological fracture: Secondary | ICD-10-CM | POA: Diagnosis not present

## 2021-12-04 DIAGNOSIS — R300 Dysuria: Secondary | ICD-10-CM

## 2021-12-04 DIAGNOSIS — Z01411 Encounter for gynecological examination (general) (routine) with abnormal findings: Secondary | ICD-10-CM | POA: Diagnosis not present

## 2021-12-04 DIAGNOSIS — N898 Other specified noninflammatory disorders of vagina: Secondary | ICD-10-CM | POA: Diagnosis not present

## 2021-12-04 DIAGNOSIS — Z853 Personal history of malignant neoplasm of breast: Secondary | ICD-10-CM | POA: Diagnosis not present

## 2021-12-04 LAB — POCT URINALYSIS DIPSTICK
Bilirubin, UA: NEGATIVE
Glucose, UA: NEGATIVE
Ketones, UA: NEGATIVE
Nitrite, UA: NEGATIVE
Protein, UA: NEGATIVE
Spec Grav, UA: <=1.005 — AB (ref 1.010–1.025)
Urobilinogen, UA: 0.2 E.U./dL
pH, UA: 6 (ref 5.0–8.0)
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
## 2021-12-05 ENCOUNTER — Other Ambulatory Visit (HOSPITAL_BASED_OUTPATIENT_CLINIC_OR_DEPARTMENT_OTHER): Payer: Self-pay | Admitting: *Deleted

## 2021-12-05 LAB — CERVICOVAGINAL ANCILLARY ONLY
Bacterial Vaginitis (gardnerella): NEGATIVE
Candida Glabrata: NEGATIVE
Candida Vaginitis: POSITIVE — AB
Comment: NEGATIVE
Comment: NEGATIVE
Comment: NEGATIVE

## 2021-12-05 MED ORDER — FLUCONAZOLE 150 MG PO TABS: 150.0000 mg | ORAL_TABLET | Freq: Once | ORAL | 0 refills | Status: AC

## 2021-12-05 NOTE — Progress Notes (Signed)
Rx for diflucan sent to pharmacy for treatment of yeast

## 2021-12-06 ENCOUNTER — Other Ambulatory Visit (HOSPITAL_BASED_OUTPATIENT_CLINIC_OR_DEPARTMENT_OTHER): Payer: Self-pay | Admitting: Obstetrics & Gynecology

## 2021-12-06 DIAGNOSIS — N3090 Cystitis, unspecified without hematuria: Secondary | ICD-10-CM

## 2021-12-06 MED ORDER — CEPHALEXIN 500 MG PO CAPS: 500.0000 mg | ORAL_CAPSULE | Freq: Four times a day (QID) | ORAL | 0 refills | Status: AC

## 2021-12-06 NOTE — Progress Notes (Signed)
Pt called with increased pressure and now seeing some blood in her urine.  She's feeling aches today as well.  Urine culture from Monday still pending.  Rx for keflex '500mg'$  qid x 5 days to pharmacy.  Pt drinking water as well.  Advised to be seen with worsening symptoms.  Will watch for results to ensure antibiotics do not need to be changes.  First line agents for cystitis discussed with pt as well.  She does not do well with large pills and has trouble with the size of Bactrim.  Also, has been prescribed macrobid in the past for UTIs so we decided to use keflex as starting medication with current symptoms.

## 2021-12-08 LAB — URINE CULTURE

## 2021-12-11 ENCOUNTER — Other Ambulatory Visit (HOSPITAL_BASED_OUTPATIENT_CLINIC_OR_DEPARTMENT_OTHER): Payer: Self-pay | Admitting: Obstetrics & Gynecology

## 2021-12-11 MED ORDER — SULFAMETHOXAZOLE-TRIMETHOPRIM 800-160 MG PO TABS: 1.0000 | ORAL_TABLET | Freq: Two times a day (BID) | ORAL | 0 refills | Status: AC

## 2021-12-13 ENCOUNTER — Encounter (HOSPITAL_BASED_OUTPATIENT_CLINIC_OR_DEPARTMENT_OTHER): Payer: Self-pay | Admitting: *Deleted
